# Patient Record
Sex: Female | Born: 1985 | Race: White | Hispanic: Yes | Marital: Married | Smoking: Never smoker
Health system: Southern US, Community
[De-identification: ages and names within clinical notes are randomized; demographics above are authoritative.]

## PROBLEM LIST (undated history)

## (undated) ENCOUNTER — Inpatient Hospital Stay (HOSPITAL_COMMUNITY): Payer: Self-pay

## (undated) DIAGNOSIS — N6019 Diffuse cystic mastopathy of unspecified breast: Secondary | ICD-10-CM

## (undated) DIAGNOSIS — Z9889 Other specified postprocedural states: Secondary | ICD-10-CM

## (undated) DIAGNOSIS — F32A Depression, unspecified: Secondary | ICD-10-CM

## (undated) DIAGNOSIS — R112 Nausea with vomiting, unspecified: Secondary | ICD-10-CM

## (undated) DIAGNOSIS — O009 Unspecified ectopic pregnancy without intrauterine pregnancy: Secondary | ICD-10-CM

## (undated) DIAGNOSIS — N390 Urinary tract infection, site not specified: Secondary | ICD-10-CM

## (undated) DIAGNOSIS — T8859XA Other complications of anesthesia, initial encounter: Secondary | ICD-10-CM

---

## 2014-01-21 DIAGNOSIS — O009 Unspecified ectopic pregnancy without intrauterine pregnancy: Secondary | ICD-10-CM

## 2014-01-21 HISTORY — DX: Unspecified ectopic pregnancy without intrauterine pregnancy: O00.90

## 2014-01-21 HISTORY — PX: LAPAROSCOPIC ABDOMINAL EXPLORATION: SHX6249

## 2014-03-24 ENCOUNTER — Ambulatory Visit (HOSPITAL_COMMUNITY)
Admission: EM | Admit: 2014-03-24 | Discharge: 2014-03-25 | Disposition: A | Discharge status: Home / Self Care | Payer: Self-pay | Attending: Family Medicine | Admitting: Family Medicine

## 2014-03-24 ENCOUNTER — Encounter (HOSPITAL_COMMUNITY): Payer: Self-pay | Admitting: Emergency Medicine

## 2014-03-24 DIAGNOSIS — R102 Pelvic and perineal pain: Secondary | ICD-10-CM

## 2014-03-24 DIAGNOSIS — N83519 Torsion of ovary and ovarian pedicle, unspecified side: Secondary | ICD-10-CM | POA: Insufficient documentation

## 2014-03-24 DIAGNOSIS — N8329 Other ovarian cysts: Secondary | ICD-10-CM | POA: Insufficient documentation

## 2014-03-24 DIAGNOSIS — Z87891 Personal history of nicotine dependence: Secondary | ICD-10-CM | POA: Insufficient documentation

## 2014-03-24 DIAGNOSIS — N8351 Torsion of ovary and ovarian pedicle: Secondary | ICD-10-CM | POA: Insufficient documentation

## 2014-03-24 HISTORY — DX: Nausea with vomiting, unspecified: R11.2

## 2014-03-24 HISTORY — DX: Other specified postprocedural states: Z98.890

## 2014-03-24 LAB — BASIC METABOLIC PANEL
Anion gap: 3 — ABNORMAL LOW (ref 5–15)
BUN: 11 mg/dL (ref 6–23)
CO2: 27 mmol/L (ref 19–32)
Calcium: 8.9 mg/dL (ref 8.4–10.5)
Chloride: 104 mmol/L (ref 96–112)
Creatinine, Ser: 0.7 mg/dL (ref 0.50–1.10)
GFR calc Af Amer: >90 mL/min (ref >90)
GFR calc non Af Amer: >90 mL/min (ref >90)
GLUCOSE: 95 mg/dL (ref 70–99)
POTASSIUM: 3.7 mmol/L (ref 3.5–5.1)
SODIUM: 134 mmol/L — AB (ref 135–145)

## 2014-03-24 LAB — CBC
HCT: 38.7 % (ref 36.0–46.0)
Hemoglobin: 13.2 g/dL (ref 12.0–15.0)
MCH: 29.8 pg (ref 26.0–34.0)
MCHC: 34.1 g/dL (ref 30.0–36.0)
MCV: 87.4 fL (ref 78.0–100.0)
Platelets: 264 10*3/uL (ref 150–400)
RBC: 4.43 MIL/uL (ref 3.87–5.11)
RDW: 12.8 % (ref 11.5–15.5)
WBC: 11.5 10*3/uL — AB (ref 4.0–10.5)

## 2014-03-24 LAB — LIPASE, BLOOD: Lipase: 41 U/L (ref 11–59)

## 2014-03-24 MED ORDER — ONDANSETRON HCL 4 MG/2ML IJ SOLN
4.0000 mg | Freq: Once | INTRAMUSCULAR | Status: AC
  Administered 2014-03-24: 4 mg via INTRAVENOUS
  Filled 2014-03-24: qty 2

## 2014-03-24 MED ORDER — FENTANYL CITRATE 0.05 MG/ML IJ SOLN
50.0000 ug | Freq: Once | INTRAMUSCULAR | Status: AC
  Administered 2014-03-24: 50 ug via INTRAVENOUS
  Filled 2014-03-24: qty 2

## 2014-03-24 MED ORDER — GI COCKTAIL ~~LOC~~
30.0000 mL | Freq: Once | ORAL | Status: AC
  Administered 2014-03-24: 30 mL via ORAL
  Filled 2014-03-24: qty 30

## 2014-03-24 NOTE — ED Provider Notes (Signed)
CSN: [id number]     Arrival date & time 03/24/14  2152 History   First MD Initiated Contact with Patient 03/24/14 2320     Chief Complaint  Patient presents with  . Abdominal Pain     (Consider location/radiation/quality/duration/timing/severity/associated sxs/prior Treatment) Patient is a 29 y.o. female presenting with abdominal pain. The history is provided by the patient.  Abdominal Pain Associated symptoms: nausea   Associated symptoms: no chest pain, no diarrhea, no shortness of breath and no vomiting    patient with acute onset of abdominal pain. It is in the left side of her abdomen. It is severe. Is worse with sitting down. Some nausea without vomiting. No diarrhea. Began acutely after eating some spicy food. States that she had the same thing a few months ago in [place] when diagnosed with colitis. Was given anti-inflammatories and some other medications. No fevers. Denies dysuria. Denies vaginal discharge.  History reviewed. No pertinent past medical history. History reviewed. No pertinent past surgical history. No family history on file. History  Substance Use Topics  . Smoking status: Not on file  . Smokeless tobacco: Not on file  . Alcohol Use: No   OB History    No data available     Review of Systems  Constitutional: Negative for activity change and appetite change.  Eyes: Negative for pain.  Respiratory: Negative for chest tightness and shortness of breath.   Cardiovascular: Negative for chest pain and leg swelling.  Gastrointestinal: Positive for nausea and abdominal pain. Negative for vomiting and diarrhea.  Genitourinary: Negative for flank pain.  Musculoskeletal: Negative for back pain and neck stiffness.  Skin: Negative for rash.  Neurological: Negative for weakness, numbness and headaches.  Psychiatric/Behavioral: Negative for behavioral problems.      Allergies  Review of patient's allergies indicates no known allergies.  Home Medications   Prior  to Admission medications   Not on File   BP 135/88 mmHg  Pulse 65  Temp(Src) 97.9 F (36.6 C)  Resp 20  Wt 129 lb (58.514 kg)  SpO2 100% Physical Exam  Constitutional: She is oriented to person, place, and time. She appears well-developed and well-nourished.  Patient standing up and over the side of the bed.  HENT:  Head: Normocephalic and atraumatic.  Eyes: Pupils are equal, round, and reactive to light.  Neck: Normal range of motion.  Cardiovascular: Normal rate, regular rhythm and normal heart sounds.   No murmur heard. Pulmonary/Chest: Effort normal and breath sounds normal. No respiratory distress. She has no wheezes. She has no rales.  Abdominal: Soft. Bowel sounds are normal. She exhibits no distension. There is tenderness. There is no rebound and no guarding.  Tenderness to left upper to left lower abdomen. No rebound or guarding.  Musculoskeletal: Normal range of motion.  Neurological: She is alert and oriented to person, place, and time. No cranial nerve deficit.  Skin: Skin is warm and dry.  Psychiatric: Her speech is normal.  Nursing note and vitals reviewed.   ED Course  Procedures (including critical care time) Labs Review Labs Reviewed  CBC - Abnormal; Notable for the following:    WBC 11.5 (*)    All other components within normal limits  BASIC METABOLIC PANEL - Abnormal; Notable for the following:    Sodium 134 (*)    Anion gap 3 (*)    All other components within normal limits  LIPASE, BLOOD  URINALYSIS, ROUTINE W REFLEX MICROSCOPIC  HEPATIC FUNCTION PANEL  POC URINE PREG, ED  Imaging Review No results found.   EKG Interpretation None      MDM   Final diagnoses:  None    Patient with abdominal pain. Acute onset but had reported colitis in the past with similar symptoms. Denies urinary symptoms. Denies pregnancy. Urine is pending. Will get CT scan due to tenderness.    Juliet RudeNathan R. Rubin PayorPickering, MD 03/24/14 2352

## 2014-03-24 NOTE — ED Notes (Signed)
Pt called out asking for pain medication.

## 2014-03-24 NOTE — ED Notes (Signed)
Pt presents with left sided abdominal pain that radiates into lower abdomen after eating hot sauce tonight, admits to vomiting X 2.  Denies vaginal or urinary symptoms.

## 2014-03-25 ENCOUNTER — Emergency Department (HOSPITAL_COMMUNITY): Payer: Self-pay

## 2014-03-25 ENCOUNTER — Inpatient Hospital Stay (HOSPITAL_COMMUNITY): Payer: Self-pay | Admitting: Anesthesiology

## 2014-03-25 ENCOUNTER — Encounter (HOSPITAL_COMMUNITY): Payer: Self-pay | Admitting: Radiology

## 2014-03-25 ENCOUNTER — Encounter (HOSPITAL_COMMUNITY)
Admission: EM | Disposition: A | Discharge status: Home / Self Care | Payer: Self-pay | Source: Home / Self Care | Attending: Emergency Medicine

## 2014-03-25 DIAGNOSIS — N8352 Torsion of fallopian tube: Secondary | ICD-10-CM

## 2014-03-25 DIAGNOSIS — N83519 Torsion of ovary and ovarian pedicle, unspecified side: Secondary | ICD-10-CM | POA: Insufficient documentation

## 2014-03-25 HISTORY — PX: LAPAROSCOPY: SHX197

## 2014-03-25 LAB — HEPATIC FUNCTION PANEL
ALBUMIN: 4.1 g/dL (ref 3.5–5.2)
ALT: 10 U/L (ref 0–35)
AST: 21 U/L (ref 0–37)
Alkaline Phosphatase: 45 U/L (ref 39–117)
Bilirubin, Direct: <0.1 mg/dL (ref 0.0–0.5)
Total Bilirubin: 0.3 mg/dL (ref 0.3–1.2)
Total Protein: 7.1 g/dL (ref 6.0–8.3)

## 2014-03-25 LAB — URINALYSIS, ROUTINE W REFLEX MICROSCOPIC
Bilirubin Urine: NEGATIVE
Glucose, UA: NEGATIVE mg/dL
Hgb urine dipstick: NEGATIVE
Leukocytes, UA: NEGATIVE
NITRITE: NEGATIVE
Protein, ur: 30 mg/dL — AB
Specific Gravity, Urine: 1.033 — ABNORMAL HIGH (ref 1.005–1.030)
UROBILINOGEN UA: 0.2 mg/dL (ref 0.0–1.0)
pH: 8 (ref 5.0–8.0)

## 2014-03-25 LAB — URINE MICROSCOPIC-ADD ON

## 2014-03-25 LAB — ABO/RH: ABO/RH(D): AB POS

## 2014-03-25 LAB — TYPE AND SCREEN
ABO/RH(D): AB POS
ANTIBODY SCREEN: NEGATIVE

## 2014-03-25 LAB — POC URINE PREG, ED: Preg Test, Ur: NEGATIVE

## 2014-03-25 SURGERY — LAPAROSCOPY OPERATIVE
Anesthesia: General | Site: Abdomen | Laterality: Right

## 2014-03-25 MED ORDER — GLYCOPYRROLATE 0.2 MG/ML IJ SOLN
INTRAMUSCULAR | Status: AC
  Filled 2014-03-25: qty 3

## 2014-03-25 MED ORDER — PHENYLEPHRINE 40 MCG/ML (10ML) SYRINGE FOR IV PUSH (FOR BLOOD PRESSURE SUPPORT)
PREFILLED_SYRINGE | INTRAVENOUS | Status: AC
  Filled 2014-03-25: qty 10

## 2014-03-25 MED ORDER — MORPHINE SULFATE 4 MG/ML IJ SOLN
4.0000 mg | Freq: Once | INTRAMUSCULAR | Status: AC
  Administered 2014-03-25: 4 mg via INTRAVENOUS
  Filled 2014-03-25: qty 1

## 2014-03-25 MED ORDER — MORPHINE SULFATE 4 MG/ML IJ SOLN
4.0000 mg | Freq: Once | INTRAMUSCULAR | Status: AC
Start: 2014-03-25 — End: 2014-03-25
  Administered 2014-03-25: 4 mg via INTRAVENOUS
  Filled 2014-03-25: qty 1

## 2014-03-25 MED ORDER — DEXAMETHASONE SODIUM PHOSPHATE 10 MG/ML IJ SOLN
INTRAMUSCULAR | Status: DC | PRN
  Administered 2014-03-25: 4 mg via INTRAVENOUS

## 2014-03-25 MED ORDER — FENTANYL CITRATE 0.05 MG/ML IJ SOLN
INTRAMUSCULAR | Status: AC
  Filled 2014-03-25: qty 5

## 2014-03-25 MED ORDER — LACTATED RINGERS IV SOLN
INTRAVENOUS | Status: DC | PRN
  Administered 2014-03-25 (×2): via INTRAVENOUS

## 2014-03-25 MED ORDER — ONDANSETRON HCL 4 MG/2ML IJ SOLN
INTRAMUSCULAR | Status: AC
  Filled 2014-03-25: qty 2

## 2014-03-25 MED ORDER — NEOSTIGMINE METHYLSULFATE 10 MG/10ML IV SOLN
INTRAVENOUS | Status: AC
  Filled 2014-03-25: qty 1

## 2014-03-25 MED ORDER — LIDOCAINE HCL (CARDIAC) 20 MG/ML IV SOLN
INTRAVENOUS | Status: AC
Start: 2014-03-25 — End: 2014-03-25
  Filled 2014-03-25: qty 5

## 2014-03-25 MED ORDER — MIDAZOLAM HCL 2 MG/2ML IJ SOLN
INTRAMUSCULAR | Status: AC
Start: 2014-03-25 — End: 2014-03-25
  Filled 2014-03-25: qty 2

## 2014-03-25 MED ORDER — HYDROMORPHONE HCL 1 MG/ML IJ SOLN
1.0000 mg | Freq: Once | INTRAMUSCULAR | Status: AC
  Administered 2014-03-25: 1 mg via INTRAVENOUS
  Filled 2014-03-25: qty 1

## 2014-03-25 MED ORDER — PROPOFOL 10 MG/ML IV BOLUS
INTRAVENOUS | Status: AC
  Filled 2014-03-25: qty 20

## 2014-03-25 MED ORDER — PHENYLEPHRINE HCL 10 MG/ML IJ SOLN
INTRAMUSCULAR | Status: DC | PRN
  Administered 2014-03-25 (×2): 80 ug via INTRAVENOUS

## 2014-03-25 MED ORDER — IOHEXOL 300 MG/ML  SOLN
100.0000 mL | Freq: Once | INTRAMUSCULAR | Status: AC | PRN
  Administered 2014-03-25: 100 mL via INTRAVENOUS

## 2014-03-25 MED ORDER — DEXAMETHASONE SODIUM PHOSPHATE 4 MG/ML IJ SOLN
INTRAMUSCULAR | Status: AC
  Filled 2014-03-25: qty 1

## 2014-03-25 MED ORDER — IOHEXOL 300 MG/ML  SOLN
25.0000 mL | Freq: Once | INTRAMUSCULAR | Status: AC | PRN
  Administered 2014-03-25: 25 mL via ORAL

## 2014-03-25 MED ORDER — GLYCOPYRROLATE 0.2 MG/ML IJ SOLN
INTRAMUSCULAR | Status: DC | PRN
  Administered 2014-03-25: 0.6 mg via INTRAVENOUS

## 2014-03-25 MED ORDER — NEOSTIGMINE METHYLSULFATE 10 MG/10ML IV SOLN
INTRAVENOUS | Status: DC | PRN
  Administered 2014-03-25: 4 mg via INTRAVENOUS

## 2014-03-25 MED ORDER — PROPOFOL 10 MG/ML IV BOLUS
INTRAVENOUS | Status: DC | PRN
  Administered 2014-03-25: 160 mg via INTRAVENOUS

## 2014-03-25 MED ORDER — ROCURONIUM BROMIDE 100 MG/10ML IV SOLN
INTRAVENOUS | Status: AC
  Filled 2014-03-25: qty 1

## 2014-03-25 MED ORDER — OXYCODONE-ACETAMINOPHEN 5-325 MG PO TABS: 1.0000 | ORAL_TABLET | Freq: Four times a day (QID) | ORAL | Status: DC | PRN

## 2014-03-25 MED ORDER — FENTANYL CITRATE 0.05 MG/ML IJ SOLN: 25.0000 ug | INTRAMUSCULAR | Status: DC | PRN

## 2014-03-25 MED ORDER — ROCURONIUM BROMIDE 100 MG/10ML IV SOLN
INTRAVENOUS | Status: DC | PRN
  Administered 2014-03-25: 20 mg via INTRAVENOUS

## 2014-03-25 MED ORDER — FENTANYL CITRATE 0.05 MG/ML IJ SOLN
INTRAMUSCULAR | Status: DC | PRN
  Administered 2014-03-25: 25 ug via INTRAVENOUS
  Administered 2014-03-25 (×2): 50 ug via INTRAVENOUS

## 2014-03-25 MED ORDER — BUPIVACAINE HCL (PF) 0.25 % IJ SOLN
INTRAMUSCULAR | Status: DC | PRN
  Administered 2014-03-25: 6 mL

## 2014-03-25 MED ORDER — KETOROLAC TROMETHAMINE 30 MG/ML IJ SOLN
INTRAMUSCULAR | Status: DC | PRN
  Administered 2014-03-25: 30 mg via INTRAVENOUS

## 2014-03-25 MED ORDER — KETOROLAC TROMETHAMINE 30 MG/ML IJ SOLN
INTRAMUSCULAR | Status: AC
  Filled 2014-03-25: qty 1

## 2014-03-25 MED ORDER — MIDAZOLAM HCL 2 MG/2ML IJ SOLN
INTRAMUSCULAR | Status: DC | PRN
  Administered 2014-03-25: 1 mg via INTRAVENOUS

## 2014-03-25 MED ORDER — LIDOCAINE HCL (CARDIAC) 20 MG/ML IV SOLN
INTRAVENOUS | Status: DC | PRN
  Administered 2014-03-25: 80 mg via INTRAVENOUS

## 2014-03-25 MED ORDER — LACTATED RINGERS IR SOLN
Status: DC | PRN
  Administered 2014-03-25: 3000 mL

## 2014-03-25 MED ORDER — ONDANSETRON HCL 4 MG/2ML IJ SOLN
INTRAMUSCULAR | Status: DC | PRN
  Administered 2014-03-25: 4 mg via INTRAVENOUS

## 2014-03-25 SURGICAL SUPPLY — 30 items
CABLE HIGH FREQUENCY MONO STRZ (ELECTRODE) IMPLANT
CATH ROBINSON RED A/P 16FR (CATHETERS) IMPLANT
CHLORAPREP W/TINT 26ML (MISCELLANEOUS) ×3 IMPLANT
CLOTH BEACON ORANGE TIMEOUT ST (SAFETY) ×3 IMPLANT
DRSG COVADERM PLUS 2X2 (GAUZE/BANDAGES/DRESSINGS) ×3 IMPLANT
DRSG OPSITE POSTOP 3X4 (GAUZE/BANDAGES/DRESSINGS) ×3 IMPLANT
FORCEPS CUTTING 33CM 5MM (CUTTING FORCEPS) IMPLANT
FORCEPS CUTTING 45CM 5MM (CUTTING FORCEPS) IMPLANT
GLOVE BIOGEL PI IND STRL 7.0 (GLOVE) ×1 IMPLANT
GLOVE BIOGEL PI INDICATOR 7.0 (GLOVE) ×2
GLOVE ECLIPSE 7.0 STRL STRAW (GLOVE) ×6 IMPLANT
GOWN STRL REUS W/TWL LRG LVL3 (GOWN DISPOSABLE) ×9 IMPLANT
LIQUID BAND (GAUZE/BANDAGES/DRESSINGS) ×3 IMPLANT
NS IRRIG 1000ML POUR BTL (IV SOLUTION) ×3 IMPLANT
PACK LAPAROSCOPY BASIN (CUSTOM PROCEDURE TRAY) ×3 IMPLANT
PAD POSITIONER PINK NONSTERILE (MISCELLANEOUS) ×3 IMPLANT
POUCH SPECIMEN RETRIEVAL 10MM (ENDOMECHANICALS) IMPLANT
PROTECTOR NERVE ULNAR (MISCELLANEOUS) ×3 IMPLANT
SET IRRIG TUBING LAPAROSCOPIC (IRRIGATION / IRRIGATOR) ×3 IMPLANT
SHEARS HARMONIC ACE PLUS 36CM (ENDOMECHANICALS) ×3 IMPLANT
SUT VIC AB 3-0 X1 27 (SUTURE) ×3 IMPLANT
SUT VICRYL 0 UR6 27IN ABS (SUTURE) ×6 IMPLANT
SUT VICRYL 4-0 PS2 18IN ABS (SUTURE) ×3 IMPLANT
TOWEL OR 17X24 6PK STRL BLUE (TOWEL DISPOSABLE) ×6 IMPLANT
TRAY FOLEY CATH 14FR (SET/KITS/TRAYS/PACK) ×3 IMPLANT
TROCAR BALLN 12MMX100 BLUNT (TROCAR) IMPLANT
TROCAR OPTI TIP 5M 100M (ENDOMECHANICALS) ×6 IMPLANT
TROCAR XCEL DIL TIP R 11M (ENDOMECHANICALS) IMPLANT
WARMER LAPAROSCOPE (MISCELLANEOUS) ×3 IMPLANT
WATER STERILE IRR 1000ML POUR (IV SOLUTION) ×3 IMPLANT

## 2014-03-25 NOTE — Anesthesia Postprocedure Evaluation (Signed)
  Anesthesia Post-op Note  Patient: Ian BushmanConsuelo Nunez  Procedure(s) Performed: Procedure(s): LAPAROSCOPY OPERATIVE with removal of right ovarian cyst wall (Right)  Patient Location: PACU  Anesthesia Type:General  Level of Consciousness: awake, alert  and oriented  Airway and Oxygen Therapy: Patient Spontanous Breathing  Post-op Pain: mild  Post-op Assessment: Post-op Vital signs reviewed, Patient's Cardiovascular Status Stable, Respiratory Function Stable, Patent Airway, No signs of Nausea or vomiting and Pain level controlled  Post-op Vital Signs: Reviewed and stable  Last Vitals:  Filed Vitals:   03/25/14 0930  BP: 124/58  Pulse: 61  Temp: 36.9 C  Resp: 23    Complications: No apparent anesthesia complications

## 2014-03-25 NOTE — Anesthesia Procedure Notes (Signed)
Procedure Name: Intubation Date/Time: 03/25/2014 7:14 AM Performed by: Suella GroveMOORE, Annetta Deiss C Pre-anesthesia Checklist: Patient identified, Emergency Drugs available, Suction available, Patient being monitored and Timeout performed Patient Re-evaluated:Patient Re-evaluated prior to inductionOxygen Delivery Method: Simple face mask and Circle system utilized Preoxygenation: Pre-oxygenation with 100% oxygen Intubation Type: IV induction Ventilation: Mask ventilation without difficulty Laryngoscope Size: Mac and 3 Grade View: Grade II Tube type: Oral Tube size: 7.0 mm Number of attempts: 1 Airway Equipment and Method: Stylet Placement Confirmation: ETT inserted through vocal cords under direct vision,  positive ETCO2 and breath sounds checked- equal and bilateral Secured at: 20 cm Tube secured with: Tape Dental Injury: Teeth and Oropharynx as per pre-operative assessment

## 2014-03-25 NOTE — Transfer of Care (Signed)
Immediate Anesthesia Transfer of Care Note  Patient: Meredith BushmanConsuelo Nunez  Procedure(s) Performed: Procedure(s): LAPAROSCOPY OPERATIVE with removal of right ovarian cyst wall (Right)  Patient Location: PACU  Anesthesia Type:General  Level of Consciousness: awake, sedated and patient cooperative  Airway & Oxygen Therapy: Patient Spontanous Breathing and Patient connected to nasal cannula oxygen  Post-op Assessment: Report given to RN and Post -op Vital signs reviewed and stable  Post vital signs: Reviewed and stable  Last Vitals:  Filed Vitals:   03/25/14 0620  BP: 130/72  Pulse: 90  Temp: 36.9 C  Resp: 20    Complications: No apparent anesthesia complications

## 2014-03-25 NOTE — Anesthesia Preprocedure Evaluation (Signed)
Anesthesia Evaluation  Patient identified by MRN, date of birth, ID band Patient awake    Reviewed: Allergy & Precautions, H&P , Patient's Chart, lab work & pertinent test results, reviewed documented beta blocker date and time   Airway Mallampati: II  TM Distance: >3 FB Neck ROM: full    Dental no notable dental hx.    Pulmonary former smoker,  breath sounds clear to auscultation  Pulmonary exam normal       Cardiovascular Rhythm:regular Rate:Normal     Neuro/Psych    GI/Hepatic   Endo/Other    Renal/GU      Musculoskeletal   Abdominal   Peds  Hematology   Anesthesia Other Findings O/w healthy, non-smoker, NPO since 03-24-14  Reproductive/Obstetrics                             Anesthesia Physical Anesthesia Plan  ASA: II and emergent  Anesthesia Plan: General   Post-op Pain Management:    Induction: Intravenous  Airway Management Planned: Oral ETT  Additional Equipment:   Intra-op Plan:   Post-operative Plan: Extubation in OR  Informed Consent: I have reviewed the patients History and Physical, chart, labs and discussed the procedure including the risks, benefits and alternatives for the proposed anesthesia with the patient or authorized representative who has indicated his/her understanding and acceptance.   Dental Advisory Given and Dental advisory given  Plan Discussed with: CRNA and Surgeon  Anesthesia Plan Comments: (  Discussed general anesthesia, including possible nausea, instrumentation of airway, sore throat,pulmonary aspiration, etc. I asked if the were any outstanding questions, or  concerns before we proceeded. )        Anesthesia Quick Evaluation

## 2014-03-25 NOTE — ED Notes (Signed)
MD at bedside. 

## 2014-03-25 NOTE — Discharge Instructions (Signed)
Ciruga laparoscpica para la torsin de ovario  (Laparoscopic Ovarian Torsion Surgery) Los ovarios son los rganos reproductores femeninos que producen vulos. Se llama torsin de ovario cuando un ovario se retuerce y corta su propio flujo de Sappingtonsangre. Si el ovario se retuerce, no puede recibir Tajikistansangre y se inflama. Esta hinchazn puede causar dolor plvico muy intenso que puede aparecer y Geneticist, moleculardesaparecer. Es necesaria una ciruga laparoscpica de la torsin ovrica para Adult nurseresolver la torsin y Ambulance personrestablecer el flujo sanguneo del ovario.  INFORME A SU MDICO ACERCA DE:   Alergias a alimentos o medicamentos.  Medicamentos que [place]utiliza, incluyendo vitaminas, hierbas, gotas oftlmicas, medicamentos de venta libre y cremas.  Uso de corticoides (por va oral o cremas).  Problemas anteriores debido a anestsicos o a medicamentos que Morgan Stanleydisminuyen la sensibilidad.  Antecedentes de hemorragias o cogulos sanguneos.  Cirugas anteriores.  Otros problemas de salud, incluyendo hipertensin, diabetes y problemas renales.  Posible embarazo. RIESGOS Y COMPLICACIONES   Reaccin alrgica a los medicamentos.  Dificultad para respirar.  Sangrado.  Infeccin.  Daos en otras estructuras del ovario. ANTES DEL PROCEDIMIENTO   Trate de consultar a su mdico si debe cambiar o suspender los medicamentos que toma habitualmente.  Si es posible, no coma ni beba nada durante al menos 8 horas antes del procedimiento.  Si fuma, deje de hacerlo. Si deja de fumar mejorar su salud despus de la Azerbaijanciruga.  Consiga que alguien lo lleve a su casa despus de la Azerbaijanciruga y lo ayude en casa mientras se recupera. PROCEDIMIENTO   Le colocarn una va intravenosa (IV) en una vena para administrarle lquidos y medicamentos.  Recibir medicamentos para relajarse y otros que lo harn dormir (anestesia general).  Le colocarn un tubo flexible (catter) en la vejiga para drenar la orina.  Tambin insertarn un tubo a travs de  la nariz o la boca hacia el estmago (sonda nasogstrica). La sonda nasogstrica drena los jugos digestivos e impide que sienta ganas de vomitar (nuseas) o tenga vmitos.  Se realizarn en el abdomen tres o cuatro incisiones pequeas (laparoscopa) o una incisin abierta. El cirujano decidir el enfoque adecuado.  Se corrige la torsin del ovario con pequeos instrumentos insertados a travs de las incisiones laparoscpicas.  Luego se observa el ovario, para verificar si vuelve el flujo sanguneo. Si no se puede restablecer el flujo de L-3 Communicationssangre en el ovario, puede tener que ser extirpado Lyondell Chemicalquirrgicamente. DESPUS DEL PROCEDIMIENTO   Planifique para permanecer en el hospital durante 1 da o menos.  Es posible que sienta clicos abdominales y Engineer, miningdolor de Advertising copywritergarganta. El dolor se puede controlar con medicamentos.  Posiblemente deba seguir una dieta lquida por un tiempo. Lo ms probable es volver a la dieta habitual y Animal nutritionisttolerarla bien al da siguiente de la Azerbaijanciruga.  Tendr que orinar a travs de un catter. El catter se retira despus de la Azerbaijanciruga.  Le harn controles regulares de la temperatura, frecuencia cardaca y respiratoria, presin arterial y nivel de oxgeno.  Seguir General Millsusando medias de compresin en las piernas hasta que pueda moverse.  Usar un dispositivo o har ejercicios de respiracin para mantener los pulmones limpios.  Lo alentarn a caminar lo ms pronto posible.  Espere una recuperacin completa de 4 a 6 semanas despus de la Azerbaijanciruga. Document Released: 10/02/2011 [facility] Patient Information 2015 [place], [place]. This information is not intended to replace advice given to you by your health care provider. Make sure you discuss any questions you have with your health care provider.

## 2014-03-25 NOTE — H&P (Signed)
Chief Complaint  Patient presents with  . Abdominal Pain   HPI   Meredith BushmanConsuelo Gonzales is a 29 y.o. [id number] who [facility]. She states that she started having pain last nigh at 1930 after eating a spicy meal. [facility]. Bedside US showed ovarian cyst, and formal US showed a torsed left ovary. She last ate at 1700 on 03/24/14.  Past Medical History  Diagnosis Date  . PONV (postoperative nausea and vomiting)     Past Surgical History  Procedure Laterality Date  . Cesarean sectionx 2      History reviewed. No pertinent family history.  History  Substance Use Topics  . Smoking status: Former Games developermoker  . Smokeless tobacco: Not on file  . Alcohol Use: No    Allergies: No Known Allergies  No prescriptions prior to admission    ROS Physical Exam   Blood pressure 130/72, pulse 90, temperature 98.4 F (36.9 C), temperature source Oral, resp. rate 20, weight 58.514 kg (129 lb), SpO2 98 %.  Physical Exam  Nursing note and vitals reviewed. Constitutional: She is oriented to person, place, and time. She appears well-developed and well-nourished. No distress.  Cardiovascular: Normal rate.  Respiratory: Effort normal.  GI: Soft. There is tenderness. There is no rebound and no guarding.  Neurological: She is alert and oriented to person, place, and time.  Skin: Skin is warm and dry.  Psychiatric: She has a normal mood and affect.    MAU Course  Procedures  Results for orders placed or performed during the hospital encounter of 03/24/14 (from the past 24 hour(s))  CBC Status: Abnormal   Collection Time: 03/24/14 9:58 PM  Result Value Ref Range   WBC 11.5 (H) 4.0 - 10.5 K/uL   RBC 4.43 3.87 - 5.11 MIL/uL   Hemoglobin 13.2 12.0 - 15.0 g/dL   HCT 14.738.7 82.936.0 - 56.246.0 %   MCV 87.4 78.0 - 100.0 fL   MCH 29.8 26.0 - 34.0 pg    MCHC 34.1 30.0 - 36.0 g/dL   RDW 13.012.8 86.511.5 - 78.415.5 %   Platelets 264 150 - 400 K/uL  Basic metabolic panel Status: Abnormal   Collection Time: 03/24/14 9:58 PM  Result Value Ref Range   Sodium 134 (L) 135 - 145 mmol/L   Potassium 3.7 3.5 - 5.1 mmol/L   Chloride 104 96 - 112 mmol/L   CO2 27 19 - 32 mmol/L   Glucose, Bld 95 70 - 99 mg/dL   BUN 11 6 - 23 mg/dL   Creatinine, Ser 6.960.70 0.50 - 1.10 mg/dL   Calcium 8.9 8.4 - 29.510.5 mg/dL   GFR calc non Af Amer >90 >90 mL/min   GFR calc Af Amer >90 >90 mL/min   Anion gap 3 (L) 5 - 15  Lipase, blood Status: None   Collection Time: 03/24/14 9:58 PM  Result Value Ref Range   Lipase 41 11 - 59 U/L  Hepatic function panel Status: None   Collection Time: 03/24/14 9:58 PM  Result Value Ref Range   Total Protein 7.1 6.0 - 8.3 g/dL   Albumin 4.1 3.5 - 5.2 g/dL   AST 21 0 - 37 U/L   ALT 10 0 - 35 U/L   Alkaline Phosphatase 45 39 - 117 U/L   Total Bilirubin 0.3 0.3 - 1.2 mg/dL   Bilirubin, Direct <2.8<0.1 0.0 - 0.5 mg/dL   Indirect Bilirubin NOT CALCULATED 0.3 -  0.9 mg/dL  Urinalysis, Routine w reflex microscopic Status: Abnormal   Collection Time: 03/25/14 12:37 AM  Result Value Ref Range   Color, Urine YELLOW YELLOW   APPearance CLOUDY (A) CLEAR   Specific Gravity, Urine 1.033 (H) 1.005 - 1.030   pH 8.0 5.0 - 8.0   Glucose, UA NEGATIVE NEGATIVE mg/dL   Hgb urine dipstick NEGATIVE NEGATIVE   Bilirubin Urine NEGATIVE NEGATIVE   Ketones, ur >80 (A) NEGATIVE mg/dL   Protein, ur 30 (A) NEGATIVE mg/dL   Urobilinogen, UA 0.2 0.0 - 1.0 mg/dL   Nitrite NEGATIVE NEGATIVE   Leukocytes, UA NEGATIVE NEGATIVE  Urine microscopic-add on Status: Abnormal   Collection Time: 03/25/14 12:37 AM  Result Value Ref Range   Squamous Epithelial / LPF RARE RARE   WBC,  UA 0-2 <3 WBC/hpf   Bacteria, UA MANY (A) RARE   Urine-Other AMORPHOUS URATES/PHOSPHATES   POC Urine Pregnancy, ED (do NOT order at [facility]) Status: None   Collection Time: 03/25/14 12:42 AM  Result Value Ref Range   Preg Test, Ur NEGATIVE NEGATIVE   US Transvaginal Non-ob  03/25/2014 CLINICAL DATA: Pelvic pain. EXAM: TRANSABDOMINAL AND TRANSVAGINAL ULTRASOUND OF PELVIS DOPPLER ULTRASOUND OF OVARIES TECHNIQUE: Both transabdominal and transvaginal ultrasound examinations of the pelvis were performed. Transabdominal technique was performed for global imaging of the pelvis including uterus, ovaries, adnexal regions, and pelvic cul-de-sac. It was necessary to proceed with endovaginal exam following the transabdominal exam to visualize the ovaries. Color and duplex Doppler ultrasound was utilized to evaluate blood flow to the ovaries. COMPARISON: Abdominal CT from the same day FINDINGS: Uterus Measurements: 10 x 5 x 3 cm. No fibroids or other mass visualized. Endometrium There is an IUD which is in good position. No endometrial thickening. Right ovary Measurements: 3.4 x 1.7 x 2.1 cm. Normal appearance/no adnexal mass. Color Doppler flow was present. Left ovary 8 x 6 x 8 cm, enlarged secondary to a 5.7 cm avascular mass which has mid level echoes and web like septation suggestive of fibrin strands. There is no color Doppler flow within the thickened surrounding ovarian parenchyma. Critical Value/emergent results were called by telephone at the time of interpretation on 03/25/2014 at 5:43 am to Dr. Deanna Artis , who verbally acknowledged these results. IMPRESSION: 1. Left ovarian torsion. 2. 5.8 cm probable hemorrhagic cyst in the left ovary. Followup imaging is recommended in 12 weeks. Electronically Signed By: Marnee Spring M.D. On: 03/25/2014 05:43    Ct Abdomen Pelvis W Contrast  03/25/2014 CLINICAL DATA: Abdominal pain. Recent diagnosis of colitis.  EXAM: CT ABDOMEN AND PELVIS WITH CONTRAST TECHNIQUE: Multidetector CT imaging of the abdomen and pelvis was performed using the standard protocol following bolus administration of intravenous contrast. CONTRAST: 25mL OMNIPAQUE IOHEXOL 300 MG/ML SOLN, OMNIPAQUE IOHEXOL 300 MG/ML SOLN COMPARISON: None. FINDINGS: BODY WALL: Unremarkable. LOWER CHEST: Unremarkable. ABDOMEN/PELVIS: Liver: No focal abnormality. Biliary: No evidence of biliary obstruction or stone. Pancreas: Unremarkable. Spleen: Unremarkable. Adrenals: Unremarkable. Kidneys and ureters: No hydronephrosis or stone. Bladder: Unremarkable. Reproductive: There is a 5 cm cyst within the left ovary with the neighboring the ovary appearing low-density and expanded. IUD which is normally positioned. Bowel: No obstruction. Normal appendix. Retroperitoneum: No mass or adenopathy. Peritoneum: No ascites or pneumoperitoneum. Vascular: No acute abnormality. 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 IMPRESSION: 5 cm left ovarian cyst. Given left sided pain and edematous appearance of the surrounding ovary, recommend Doppler interrogation of ovarian blood flow. Electronically Signed By: Marnee Spring M.D. On: 03/25/2014  01:51    Assessment and Plan   1. Ovarian torsion   2. Pelvic pain in female     To the OR for laparoscopic detorsion and possible ovarian cystectomy.  Discussed possible oophorectomy. Risks include but are not limited to bleeding, infection, injury to surrounding structures, including bowel, bladder and ureters, blood clots, and death.  Likelihood of success is high.

## 2014-03-25 NOTE — ED Notes (Signed)
Pt still in ultrasound.

## 2014-03-25 NOTE — MAU Note (Signed)
[facility]     WITH INTERPRETER- MADAY-  SAYS SHE ATE  AT 5PM- AND  PAIN IN LEFT LOWER ABD  STARTED  AT 730PM-  SHE WENT  TO [facility]  AT 9PM .

## 2014-03-25 NOTE — ED Provider Notes (Signed)
MSE: Sent from CT to US based on results Called by Dr. Grace IsaacWatts for US results Case d/w Dr. Shawnie PonsPratt via phone who will accept patient to the MAU, d/w Herbert SetaHeather NP Interpretor phone used for interview and update of patient on CT and US results.  Pain started at 7 pm last meal was 530 ish  In pain, is pacing floor RRR CTA NABS FROM x 4  Pain medication ordered, emtala completed carelink here for transfer  Jaydynn Wolford Smitty CordsK Somer Trotter-Rasch, MD 03/25/14 [phone]

## 2014-03-25 NOTE — MAU Note (Signed)
TO OR VIA STRETCHER.

## 2014-03-25 NOTE — Op Note (Signed)
PROCEDURE DATE: 03/25/2014  PREOPERATIVE DIAGNOSES: Left ovarian torsion  POSTOPERATIVE DIAGNOSES: The same   PROCEDURE: Laparoscopic ovarian cystectomy  SURGEON: Dr. Reva Boresanya S Icyss Skog   ASSISTANT: None  ANESTHESIOLOGIST: Tyrone AppleMichael A. Malen GauzeFoster, MD MD - GETT  INDICATIONS: 29 y.o. [phone] with history of sudden onset abdominal pain last pm, found to have ovarian torsion  FINDINGS: Left ovarian torsion, blood in the abdomen, ovarian cyst, left, normal right tube and ovary   ESTIMATED BLOOD LOSS: 150 ml   SPECIMENS: Left ovarian cyst wall  COMPLICATIONS: None immediately known   PROCEDURE IN DETAIL: The patient had sequential compression devices applied to her lower extremities while in the preoperative area. She was then taken to the operating room where general anesthesia was administered and was found to be adequate. She was placed in the dorsal lithotomy position, and was prepped and draped in a sterile manner. A Foley catheter was inserted into her bladder and attached to constant drainage and an acorn tenaculum was placed in the cervix. After an adequate timeout was performed, attention was then turned to the patient's abdomen where a 11-mm skin incision was made in the umbilicus.  This was carried down to the underlying fascia and peritoneum.  The fascia was tagged with 0 Vicryl suture on a UR-6. Intraperitoneal placement was confirmed and insufflation done. A survey of the patient's pelvis and abdomen revealed the findings above. Two 5-mm left lower quadrant ports were then placed under direct visualization. On the left side, the ovary was noted to be torsed.  It was untwisted. There was a small omental adhesion to the anterior abdominal wall and this was taken down with the Harmonic scalpel.  Harmonic scalpel used to open the left ovarian parenchyma and blood and clot removed.  Cyst wall identified and removed from the ovary. Hemostasis at base achieved with Harmonic scalpel.  Specimen removed  though 5 mm ports in pieces. The operative site was surveyed, and found to be hemostatic. No intraoperative injury to other surrounding organs was noted. The abdomen was desufflated and all instruments were then removed from the patient's abdomen. No fascial closure was needed. All skin incisions were closed with 3-0 Vicryl subcuticular stitches/Dermabond.   Reva Boresanya S Gregorio Worley MD 03/25/2014 8:11 AM

## 2014-03-25 NOTE — MAU Provider Note (Signed)
History     CSN: [id number]  Arrival date and time: 03/24/14 2152   First Provider Initiated Contact with Patient 03/25/14 [phone]      Chief Complaint  Patient presents with  . Abdominal Pain   HPI   Meredith Gonzales is a 29 y.o. [id number] who presents today as a transfer from [facility] with an ovarian torsion. She states that she started having pain last nigh at 1930 after eating a spicy meal. She was seen at [facility] for presumed gastritis.  Bedside US showed ovarian cyst, and formal US showed a torsed left ovary. She last ate at 1700 on 03/24/14.  Past Medical History  Diagnosis Date  . PONV (postoperative nausea and vomiting)     Past Surgical History  Procedure Laterality Date  . Cesarean section      History reviewed. No pertinent family history.  History  Substance Use Topics  . Smoking status: Former Games developer  . Smokeless tobacco: Not on file  . Alcohol Use: No    Allergies: No Known Allergies  No prescriptions prior to admission    ROS Physical Exam   Blood pressure 130/72, pulse 90, temperature 98.4 F (36.9 C), temperature source Oral, resp. rate 20, weight 58.514 kg (129 lb), SpO2 98 %.  Physical Exam  Nursing note and vitals reviewed. Constitutional: She is oriented to person, place, and time. She appears well-developed and well-nourished. No distress.  Cardiovascular: Normal rate.   Respiratory: Effort normal.  GI: Soft. There is tenderness. There is no rebound and no guarding.  Neurological: She is alert and oriented to person, place, and time.  Skin: Skin is warm and dry.  Psychiatric: She has a normal mood and affect.    MAU Course  Procedures  Results for orders placed or performed during the hospital encounter of 03/24/14 (from the past 24 hour(s))  CBC     Status: Abnormal   Collection Time: 03/24/14  9:58 PM  Result Value Ref Range   WBC 11.5 (H) 4.0 - 10.5 K/uL   RBC 4.43 3.87 - 5.11 MIL/uL   Hemoglobin 13.2 12.0 - 15.0 g/dL   HCT 29.5 62.1 -  30.8 %   MCV 87.4 78.0 - 100.0 fL   MCH 29.8 26.0 - 34.0 pg   MCHC 34.1 30.0 - 36.0 g/dL   RDW 65.7 84.6 - 96.2 %   Platelets 264 150 - 400 K/uL  Basic metabolic panel     Status: Abnormal   Collection Time: 03/24/14  9:58 PM  Result Value Ref Range   Sodium 134 (L) 135 - 145 mmol/L   Potassium 3.7 3.5 - 5.1 mmol/L   Chloride 104 96 - 112 mmol/L   CO2 27 19 - 32 mmol/L   Glucose, Bld 95 70 - 99 mg/dL   BUN 11 6 - 23 mg/dL   Creatinine, Ser 9.52 0.50 - 1.10 mg/dL   Calcium 8.9 8.4 - 84.1 mg/dL   GFR calc non Af Amer >90 >90 mL/min   GFR calc Af Amer >90 >90 mL/min   Anion gap 3 (L) 5 - 15  Lipase, blood     Status: None   Collection Time: 03/24/14  9:58 PM  Result Value Ref Range   Lipase 41 11 - 59 U/L  Hepatic function panel     Status: None   Collection Time: 03/24/14  9:58 PM  Result Value Ref Range   Total Protein 7.1 6.0 - 8.3 g/dL   Albumin 4.1 3.5 - 5.2 g/dL  AST 21 0 - 37 U/L   ALT 10 0 - 35 U/L   Alkaline Phosphatase 45 39 - 117 U/L   Total Bilirubin 0.3 0.3 - 1.2 mg/dL   Bilirubin, Direct <7.5 0.0 - 0.5 mg/dL   Indirect Bilirubin NOT CALCULATED 0.3 - 0.9 mg/dL  Urinalysis, Routine w reflex microscopic     Status: Abnormal   Collection Time: 03/25/14 12:37 AM  Result Value Ref Range   Color, Urine YELLOW YELLOW   APPearance CLOUDY (A) CLEAR   Specific Gravity, Urine 1.033 (H) 1.005 - 1.030   pH 8.0 5.0 - 8.0   Glucose, UA NEGATIVE NEGATIVE mg/dL   Hgb urine dipstick NEGATIVE NEGATIVE   Bilirubin Urine NEGATIVE NEGATIVE   Ketones, ur >80 (A) NEGATIVE mg/dL   Protein, ur 30 (A) NEGATIVE mg/dL   Urobilinogen, UA 0.2 0.0 - 1.0 mg/dL   Nitrite NEGATIVE NEGATIVE   Leukocytes, UA NEGATIVE NEGATIVE  Urine microscopic-add on     Status: Abnormal   Collection Time: 03/25/14 12:37 AM  Result Value Ref Range   Squamous Epithelial / LPF RARE RARE   WBC, UA 0-2 <3 WBC/hpf   Bacteria, UA MANY (A) RARE   Urine-Other AMORPHOUS URATES/PHOSPHATES   POC Urine Pregnancy,  ED (do NOT order at [facility])     Status: None   Collection Time: 03/25/14 12:42 AM  Result Value Ref Range   Preg Test, Ur NEGATIVE NEGATIVE   US Transvaginal Non-ob  03/25/2014   CLINICAL DATA:  Pelvic pain.  EXAM: TRANSABDOMINAL AND TRANSVAGINAL ULTRASOUND OF PELVIS  DOPPLER ULTRASOUND OF OVARIES  TECHNIQUE: Both transabdominal and transvaginal ultrasound examinations of the pelvis were performed. Transabdominal technique was performed for global imaging of the pelvis including uterus, ovaries, adnexal regions, and pelvic cul-de-sac.  It was necessary to proceed with endovaginal exam following the transabdominal exam to visualize the ovaries. Color and duplex Doppler ultrasound was utilized to evaluate blood flow to the ovaries.  COMPARISON:  Abdominal CT from the same day  FINDINGS: Uterus  Measurements: 10 x 5 x 3 cm. No fibroids or other mass visualized.  Endometrium  There is an IUD which is in good position. No endometrial thickening.  Right ovary  Measurements: 3.4 x 1.7 x 2.1 cm. Normal appearance/no adnexal mass. Color Doppler flow was present.  Left ovary  8 x 6 x 8 cm, enlarged secondary to a 5.7 cm avascular mass which has mid level echoes and web like septation suggestive of fibrin strands. There is no color Doppler flow within the thickened surrounding ovarian parenchyma.  Critical Value/emergent results were called by telephone at the time of interpretation on 03/25/2014 at 5:43 am to Dr. Deanna Artis , who verbally acknowledged these results.  IMPRESSION: 1. Left ovarian torsion. 2. 5.8 cm probable hemorrhagic cyst in the left ovary. Followup imaging is recommended in 12 weeks.   Electronically Signed   By: Marnee Spring M.D.   On: 03/25/2014 05:43   US Pelvis Complete  03/25/2014   CLINICAL DATA:  Pelvic pain.  EXAM: TRANSABDOMINAL AND TRANSVAGINAL ULTRASOUND OF PELVIS  DOPPLER ULTRASOUND OF OVARIES  TECHNIQUE: Both transabdominal and transvaginal ultrasound examinations of the pelvis  were performed. Transabdominal technique was performed for global imaging of the pelvis including uterus, ovaries, adnexal regions, and pelvic cul-de-sac.  It was necessary to proceed with endovaginal exam following the transabdominal exam to visualize the ovaries. Color and duplex Doppler ultrasound was utilized to evaluate blood flow to the ovaries.  COMPARISON:  Abdominal  CT from the same day  FINDINGS: Uterus  Measurements: 10 x 5 x 3 cm. No fibroids or other mass visualized.  Endometrium  There is an IUD which is in good position. No endometrial thickening.  Right ovary  Measurements: 3.4 x 1.7 x 2.1 cm. Normal appearance/no adnexal mass. Color Doppler flow was present.  Left ovary  8 x 6 x 8 cm, enlarged secondary to a 5.7 cm avascular mass which has mid level echoes and web like septation suggestive of fibrin strands. There is no color Doppler flow within the thickened surrounding ovarian parenchyma.  Critical Value/emergent results were called by telephone at the time of interpretation on 03/25/2014 at 5:43 am to Dr. Deanna ArtisAPRIL PALUMBO-RASCH , who verbally acknowledged these results.  IMPRESSION: 1. Left ovarian torsion. 2. 5.8 cm probable hemorrhagic cyst in the left ovary. Followup imaging is recommended in 12 weeks.   Electronically Signed   By: Marnee SpringJonathon  Watts M.D.   On: 03/25/2014 05:43   Ct Abdomen Pelvis W Contrast  03/25/2014   CLINICAL DATA:  Abdominal pain.  Recent diagnosis of colitis.  EXAM: CT ABDOMEN AND PELVIS WITH CONTRAST  TECHNIQUE: Multidetector CT imaging of the abdomen and pelvis was performed using the standard protocol following bolus administration of intravenous contrast.  CONTRAST:  25mL OMNIPAQUE IOHEXOL 300 MG/ML SOLN, 100mL OMNIPAQUE IOHEXOL 300 MG/ML SOLN  COMPARISON:  None.  FINDINGS: BODY WALL: Unremarkable.  LOWER CHEST: Unremarkable.  ABDOMEN/PELVIS:  Liver: No focal abnormality.  Biliary: No evidence of biliary obstruction or stone.  Pancreas: Unremarkable.  Spleen:  Unremarkable.  Adrenals: Unremarkable.  Kidneys and ureters: No hydronephrosis or stone.  Bladder: Unremarkable.  Reproductive: There is a 5 cm cyst within the left ovary with the neighboring the ovary appearing low-density and expanded. IUD which is normally positioned.  Bowel: No obstruction. Normal appendix.  Retroperitoneum: No mass or adenopathy.  Peritoneum: No ascites or pneumoperitoneum.  Vascular: No acute abnormality.  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  IMPRESSION: 5 cm left ovarian cyst. Given left sided pain and edematous appearance of the surrounding ovary, recommend Doppler interrogation of ovarian blood flow.   Electronically Signed   By: Marnee SpringJonathon  Watts M.D.   On: 03/25/2014 01:51   [place] Art/ven Flow Abd Pelv Doppler  03/25/2014   CLINICAL DATA:  Pelvic pain.  EXAM: TRANSABDOMINAL AND TRANSVAGINAL ULTRASOUND OF PELVIS  DOPPLER ULTRASOUND OF OVARIES  TECHNIQUE: Both transabdominal and transvaginal ultrasound examinations of the pelvis were performed. Transabdominal technique was performed for global imaging of the pelvis including uterus, ovaries, adnexal regions, and pelvic cul-de-sac.  It was necessary to proceed with endovaginal exam following the transabdominal exam to visualize the ovaries. Color and duplex Doppler ultrasound was utilized to evaluate blood flow to the ovaries.  COMPARISON:  Abdominal CT from the same day  FINDINGS: Uterus  Measurements: 10 x 5 x 3 cm. No fibroids or other mass visualized.  Endometrium  There is an IUD which is in good position. No endometrial thickening.  Right ovary  Measurements: 3.4 x 1.7 x 2.1 cm. Normal appearance/no adnexal mass. Color Doppler flow was present.  Left ovary  8 x 6 x 8 cm, enlarged secondary to a 5.7 cm avascular mass which has mid level echoes and web like septation suggestive of fibrin strands. There is no color Doppler flow within the thickened surrounding ovarian parenchyma.  Critical Value/emergent results  were called by telephone at the time of interpretation on 03/25/2014 at 5:43 am to Dr. Morene AntuAPRIL  [facility] , who verbally acknowledged these results.  IMPRESSION: 1. Left ovarian torsion. 2. 5.8 cm probable hemorrhagic cyst in the left ovary. Followup imaging is recommended in 12 weeks.   Electronically Signed   By: Marnee Spring M.D.   On: 03/25/2014 05:43    1610: Dr. Shawnie Pons notified, and she will see the patient.  Assessment and Plan   1. Ovarian torsion   2. Pelvic pain in female     To the OR  Tawnya Crook 03/25/2014, 6:26 AM

## 2014-03-28 ENCOUNTER — Encounter (HOSPITAL_COMMUNITY): Payer: Self-pay | Admitting: Family Medicine

## 2014-04-08 ENCOUNTER — Ambulatory Visit (INDEPENDENT_AMBULATORY_CARE_PROVIDER_SITE_OTHER): Payer: Self-pay | Admitting: Obstetrics & Gynecology

## 2014-04-08 ENCOUNTER — Encounter: Payer: Self-pay | Admitting: Obstetrics & Gynecology

## 2014-04-08 VITALS — BP 115/58 | HR 71 | Temp 97.5°F | Ht 64.17 in | Wt 123.4 lb

## 2014-04-08 DIAGNOSIS — Z9889 Other specified postprocedural states: Secondary | ICD-10-CM

## 2014-04-08 NOTE — Progress Notes (Signed)
Subjective:     Patient ID: Meredith Gonzales, female   DOB: 01-15-1986, 29 y.o.   MRN: [id number]  HPIPt presents for 2 weeks post op check. She is without complaints.  She is performing her routine activities at home without difficulty.      Review of Systems     Objective:   Physical Exam BP 115/58 mmHg  Pulse 71  Temp(Src) 97.5 F (36.4 C) (Oral)  Ht 5' 4.17" (1.63 m)  Wt 123 lb 6.4 oz (55.974 kg)  BMI 21.07 kg/m2 Pt in NAD Abd: soft, NT, ND  Port sites healing well    03/25/2014 Diagnosis Ovary, cyst, right wall - BLOOD CLOT - NO ENDOMETRIOSIS, ATYPIA, OR MALIGNANCY.    Assessment:     2 week post op check - doing well    Plan:    f/u prn Return to full activities [name] used for Spanish interpreter

## 2014-04-08 NOTE — Patient Instructions (Signed)
Quistectoma ovrica: cuidados posteriores (Ovarian Cystectomy, Care After) Siga estas instrucciones durante las prximas semanas. Estas indicaciones le proporcionan informacin general acerca de cmo deber cuidarse despus del procedimiento. El mdico tambin podr darle instrucciones ms especficas. El tratamiento ha sido planificado segn las prcticas mdicas actuales, pero en algunos casos pueden ocurrir problemas. Comunquese con el mdico si tiene algn problema o tiene dudas despus del procedimiento.  QU ESPERAR DESPUS DEL PROCEDIMIENTO Despus del procedimiento, es comn tener las siguientes sensaciones:  Dolor en el abdomen, especialmente en el lugar de la incisin. Le darn analgsicos para Human resources officer.  Cansancio. Es Neomia Dear etapa normal del proceso de recuperacin. Su nivel de Chief Financial Officer a la normalidad te la prximas semanas.  Estreimiento INSTRUCCIONES PARA EL CUIDADO EN EL [employer] solo medicamentos de venta libre o recetados, segn las indicaciones del mdico. Evite tomar aspirina porque puede provocar hemorragia.  Siga las indicaciones del mdico sobre cundo debe retomar la dieta habitual, el ejercicio y [place].  Durante el da descanse cuanto sea necesario.  No se haga duchas vaginales ni tenga relaciones sexuales hasta que el mdico lo autorice.  Cambie o retire las vendas (vendajes) segn las indicaciones del mdico.  No conduzca vehculos hasta que el mdico lo autorice.  Tome Museum/gallery curator de baos de inmersin hasta que el mdico le indique otra cosa.  Si est estreida podr:  Tomar un laxante suave si el mdico se lo autoriza.  Agregar frutas y salvado a su dieta.  Beber ms lquidos.  Tmese la [employer] veces por da y Engineering geologist.  No beba alcohol si toma analgsicos.  Trate de que alguien la acompae en su casa durante una o dos semanas despus del procedimiento, para ayudarla con los quehaceres  domsticos.  Concurra a las consultas de control con su mdico segn las indicaciones. SOLICITE ATENCIN MDICA SI:  Lance Muss.  Tiene Programme researcher, broadcasting/film/video (nuseas) y vomita.  Observa enrojecimiento, hinchazn o prdida de lquido en el lugar de la incisin.  Siente dolor al orinar u observa sangre en la orina.  Tiene una erupcin cutnea en el cuerpo.  Tiene dolor o Microbiologist donde se coloc la va intravenosa (IV).  Siente un dolor que no se alivia con medicamentos. SOLICITE ATENCIN MDICA DE INMEDIATO SI:  Siente falta de aire o dolor en el pecho.  Se siente mareado o sufre un desmayo.  El dolor abdominal aumenta y no se alivia con medicamentos.  Siente dolor, u observa hinchazn o enrojecimiento en la pierna.  Anola Gurney una secrecin de color blanco amarillento (pus) en el lugar de la incisin.  La incisin se abre (los bordes no permanecen unidos). Document Released: 10/28/2012 [facility] Patient Information 2015 [place], [place]. This information is not intended to replace advice given to you by your health care provider. Make sure you discuss any questions you have with your health care provider. Quistectoma ovrica (Ovarian Cystectomy) La quistectoma ovrica es una ciruga que se realiza para extirpar una bolsa llena de lquido (quiste) de un ovario. Los ovarios son los rganos pequeos que producen vulos en las mujeres. Se pueden formar varios tipos de [employer]. Katha Hamming no son cancerosos. Posiblemente se realice una ciruga si el quiste es grande o causa sntomas como dolor. Tambin se puede Games developer caso de que el quiste sea canceroso o exista la posibilidad de que lo sea. Esta ciruga se puede realizar mediante una tcnica laparoscpica o una tcnica de abdomen  abierto. La tcnica laparoscpica involucra cortes ms pequeos (incisiones) y un proceso de recuperacin ms rpido. La tcnica que se use depender de la edad de la  [place], el tipo de quiste y si este es canceroso. La tcnica laparoscpica no se [place]ausa para quistes cancerosos. INFORME A SU MDICO:   Cualquier alergia que tenga.  Todos los Walt Disneymedicamentos que utiliza, incluidos vitaminas, hierbas, gotas oftlmicas, cremas y [address]icamentos de [address] libre.  Problemas previos que usted o los Graybar Electricmiembros de su familia hayan tenido con el uso de anestsicos.  Enfermedades de Clear Channel Communicationsla sangre.  Cirugas previas.  Enfermedades patolgicas.  Cualquier posibilidad de que pueda AES Corporationestar embarazada. RIESGOS Y COMPLICACIONES En general, se trata de un procedimiento seguro. Sin embargo, Tree surgeoncomo en cualquier procedimiento, pueden surgir complicaciones. Las complicaciones posibles son:  Sharlyne PacasSangrado excesivo.  Infeccin.  Lesiones en otros rganos.  Cogulos sanguneos.  Imposibilidad para quedar embarazada (infertilidad). ANTES DEL PROCEDIMIENTO  Consulte a su mdico si debe cambiar o suspender los medicamentos que toma habitualmente. Evite tomar aspirinas, ibuprofeno o anticoagulantes, segn las indicaciones del mdico.  No coma ni beba nada despus de la medianoche anterior a la ciruga.  Si fuma, no lo haga Barnes & Nobledurante las 2semanas previas a la Azerbaijanciruga, como mnimo.  No beba alcohol el da anterior a la Azerbaijanciruga.  Infrmele al mdico si contrae un resfro o alguna infeccin antes de la Azerbaijanciruga.  Pdale a alguien que lo lleve a su casa despus del procedimiento o de la hospitalizacin. Tambin pdale a alguna persona que lo ayude con sus actividades mientras se recupera. PROCEDIMIENTO  Para esta ciruga se puede usar tanto la tcnica laparoscpica como la tcnica de abdomen abierto.  Le colocarn pequeos monitores en el cuerpo. Estos controlarn su corazn, la presin arterial y Air cabin crewel nivel de oxgeno.  Se le colocar un acceso intravenoso en una de las venas. A travs de esta va intravenosa (IV) los medicamentos pasarn directamente hacia el cuerpo.  Es posible que le  administren un medicamento para ayudarlo a Lexicographerrelajarse (sedante).  Le administrarn un medicamento que la har dormir (anestesia general). Durante el procedimiento, posiblemente le coloquen un respirador. Tcnica laparoscpica  Le harn varios cortes pequeos (incisiones) en el abdomen. Normalmente tienen entre un 1,5 y 2 centmetros de longitud.  Se llenar el abdomen de gas de dixido de carbono para expandirlo. Esto permite que el cirujano tenga ms espacio para operar y Midwifefacilita la visualizacin de los rganos.  Le insertarn un tubo delgado luminoso, que tiene una pequea cmara en el extremo (laparoscopio) a travs de una de las pequeas incisiones. La cmara del laparoscopio enva una imagen a una pantalla de televisin que se encuentra en el quirfano. De este modo, el cirujano tendr Neomia Dearuna buena visin del interior del abdomen.  A travs de las otras pequeas incisiones del abdomen se insertan tubos huecos. A travs de estos tubos se coloca el instrumental necesario para los procedimientos.  Se identifica el ovario con el quiste y Shorelineeste se extirpa y se enva al laboratorio para que sea examinado. Si se trata de cncer, posiblemente se deban extirpar ambos ovarios en una ciruga diferente.  Se retiran los instrumentos y luego se cierran las incisiones con puntos o pegamento para la piel, y probablemente se coloquen vendajes. Tcnica de abdomen abierto  Se realiza una sola incisin grande a lo largo de la lnea del biquini o en el medio de la zona inferior del abdomen.  Se identifica el ovario con el quiste y [place] Parkeste se extirpa y  se enva al laboratorio para que sea examinado. Si se trata de cncer, posiblemente se deban extirpar ambos ovarios en una ciruga diferente.  Luego se cierra la incisin con puntos o grapas. DESPUS DEL PROCEDIMIENTO   Una vez que despierte de la anestesia la trasladarn a una sala de recuperacin.  Si se someti a Patent examiner, es posible que pueda  volver a su casa el mismo da del procedimiento, o puede Environmental health practitioner en observacin en el hospital durante la noche.  Si se someti a una ciruga abdominal, deber [employer].  Le retirarn la va intravenosa y el catter entre uno o [address] despus del procedimiento, una vez que pueda comer y beber lo suficiente.  Es posible que le den medicamentos para Engineer, materials o para que pueda dormir.  De ser necesario, probablemente le den antibiticos. Document Released: 10/28/2012 [facility] Patient Information 2015 [place], [place]. This information is not intended to replace advice given to you by your health care provider. Make sure you discuss any questions you have with your health care provider.

## 2016-01-07 ENCOUNTER — Ambulatory Visit (HOSPITAL_COMMUNITY)
Admission: EM | Admit: 2016-01-07 | Discharge: 2016-01-07 | Disposition: A | Discharge status: Home / Self Care | Payer: Self-pay | Attending: Family Medicine | Admitting: Family Medicine

## 2016-01-07 ENCOUNTER — Encounter (HOSPITAL_COMMUNITY): Payer: Self-pay | Admitting: *Deleted

## 2016-01-07 DIAGNOSIS — J069 Acute upper respiratory infection, unspecified: Secondary | ICD-10-CM

## 2016-01-07 DIAGNOSIS — B9789 Other viral agents as the cause of diseases classified elsewhere: Secondary | ICD-10-CM

## 2016-01-07 MED ORDER — HYDROCOD POLST-CPM POLST ER 10-8 MG/5ML PO SUER: 5.0000 mL | Freq: Two times a day (BID) | ORAL | 0 refills | Status: DC | PRN

## 2016-01-07 MED ORDER — IBUPROFEN 400 MG PO TABS: 400.0000 mg | ORAL_TABLET | Freq: Four times a day (QID) | ORAL | 0 refills | Status: AC | PRN

## 2016-01-07 NOTE — ED Provider Notes (Signed)
[facility]    CSN: [id number] Arrival date & time: 01/07/16  1407     History   Chief Complaint Chief Complaint  Patient presents with  . Cough    HPI Meredith BushmanConsuelo Gonzales is a 30 y.o. female.   The history is provided by the spouse. The history is limited by a language barrier.  Cough  Cough characteristics:  Productive Sputum characteristics:  Green Severity:  Moderate Onset quality:  Gradual Duration:  1 day Timing:  Intermittent Progression:  Waxing and waning Chronicity:  New Smoker: no   Context: not animal exposure, not occupational exposure, not sick contacts and not smoke exposure   Relieved by:  Nothing Worsened by:  Nothing Ineffective treatments:  None tried Associated symptoms: chest pain, headaches and myalgias   Associated symptoms: no ear pain, no eye discharge and no fever   Positive postnasal drainage. She got flu shot in Oct.  Past Medical History:  Diagnosis Date  . PONV (postoperative nausea and vomiting)     Patient Active Problem List   Diagnosis Date Noted  . Ovarian torsion     Past Surgical History:  Procedure Laterality Date  . CESAREAN SECTION    . LAPAROSCOPY Right 03/25/2014   Procedure: LAPAROSCOPY OPERATIVE with removal of right ovarian cyst wall;  Surgeon: Reva Boresanya S Pratt, MD;  Location: [facility];  Service: Gynecology;  Laterality: Right;    OB History    Gravida Para Term Preterm AB Living   2 2 2     2    SAB TAB Ectopic Multiple Live Births                   Home Medications    Prior to Admission medications   Not on File    Family History No family history on file.  Social History Social History  Substance Use Topics  . Smoking status: Former Games developermoker  . Smokeless tobacco: Not on file  . Alcohol use No     Allergies   Patient has no known allergies.   Review of Systems Review of Systems  Constitutional: Negative for fever.  HENT: Negative for ear pain.   Eyes: Negative for discharge.    Respiratory: Positive for cough.   Cardiovascular: Positive for chest pain.  Musculoskeletal: Positive for myalgias.  Neurological: Positive for headaches.  All other systems reviewed and are negative.    Physical Exam Triage Vital Signs ED Triage Vitals  Enc Vitals Group     BP 01/07/16 1544 121/92     Pulse Rate 01/07/16 1544 102     Resp 01/07/16 1544 16     Temp 01/07/16 1544 98.2 F (36.8 C)     Temp Source 01/07/16 1544 Oral     SpO2 01/07/16 1544 100 %     Weight --      Height --      Head Circumference --      Peak Flow --      Pain Score 01/07/16 1546 8     Pain Loc --      Pain Edu? --      Excl. in GC? --    No data found.   Updated Vital Signs BP 121/92   Pulse 102   Temp 98.2 F (36.8 C) (Oral)   Resp 16   SpO2 100%   Visual Acuity Right Eye Distance:   Left Eye Distance:   Bilateral Distance:    Right Eye Near:   Left Eye  Near:    Bilateral Near:     Physical Exam  Constitutional: She is oriented to person, place, and time. She appears well-developed. No distress.  HENT:  Head: Normocephalic.  Right Ear: External ear normal.  Left Ear: External ear normal.  Nose: Nose normal.  Mouth/Throat: Oropharynx is clear and moist. No oropharyngeal exudate.  Eyes: Conjunctivae are normal. Right eye exhibits no discharge. Left eye exhibits no discharge.  Cardiovascular: Normal rate, regular rhythm and normal heart sounds.   No murmur heard. Pulmonary/Chest: Effort normal and breath sounds normal. No respiratory distress. She has no wheezes. She has no rales.  Lymphadenopathy:    She has no cervical adenopathy.  Neurological: She is alert and oriented to person, place, and time.  Vitals reviewed.    UC Treatments / Results  Labs (all labs ordered are listed, but only abnormal results are displayed) Labs Reviewed - No data to display  EKG  EKG Interpretation None       Radiology No results found.  Procedures Procedures (including  critical care time)  Medications Ordered in UC Medications - No data to display   Initial Impression / Assessment and Plan / UC Course  I have reviewed the triage vital signs and the nursing notes.  Pertinent labs & imaging results that were available during my care of the patient were reviewed by me and considered in my medical decision making (see chart for details).  Clinical Course as of Jan 07 1615  Sun Jan 07, 2016  1614 Upper resp tract infection. Patient reassured antibiotic is not needed. Tussionex prescribed prn cough. Use Ibuprofen as needed for pain. Rest at home and keep self well hydrated. F/U soon if symptoms persist.  [KE]    Clinical Course User Index [KE] Doreene ElandKehinde T Graden Hoshino, MD      Final Clinical Impressions(s) / UC Diagnoses   Final diagnoses:  None   Viral URI with cough   New Prescriptions New Prescriptions   No medications on file     Doreene ElandKehinde T Nevin Grizzle, MD 01/07/16 1617

## 2016-01-07 NOTE — ED Notes (Signed)
On discharge from department, patient/family member were voicing concerns in regards to not receiving antibiotic script.  Tried to explain.  Offered to get provider, patient/family member agreed.  Notified dr Lum Babeeniola.

## 2016-01-07 NOTE — ED Triage Notes (Signed)
C/O nasal congestion, cough, greenish nasal discharge, chest pain and SOB x 4 days.  Has not been taking any medicines at home.

## 2016-01-07 NOTE — Discharge Instructions (Signed)
It was nice to see you. It sounds like you have viral upper respiratory tract infection. Antibiotic is not needed for this. Please take cough medication as prescribed. Rest at home and keep well hydrated. Call if symptoms worsens.

## 2017-10-24 ENCOUNTER — Emergency Department (HOSPITAL_COMMUNITY)
Admission: EM | Admit: 2017-10-24 | Discharge: 2017-10-25 | Disposition: A | Discharge status: Home / Self Care | Payer: Self-pay | Attending: Emergency Medicine | Admitting: Emergency Medicine

## 2017-10-24 ENCOUNTER — Other Ambulatory Visit: Payer: Self-pay

## 2017-10-24 ENCOUNTER — Emergency Department (HOSPITAL_COMMUNITY): Payer: Self-pay

## 2017-10-24 ENCOUNTER — Encounter (HOSPITAL_COMMUNITY): Payer: Self-pay | Admitting: Emergency Medicine

## 2017-10-24 DIAGNOSIS — Z87891 Personal history of nicotine dependence: Secondary | ICD-10-CM | POA: Insufficient documentation

## 2017-10-24 DIAGNOSIS — Z79899 Other long term (current) drug therapy: Secondary | ICD-10-CM | POA: Insufficient documentation

## 2017-10-24 DIAGNOSIS — F410 Panic disorder [episodic paroxysmal anxiety] without agoraphobia: Secondary | ICD-10-CM | POA: Insufficient documentation

## 2017-10-24 LAB — BASIC METABOLIC PANEL
Anion gap: 10 (ref 5–15)
BUN: 10 mg/dL (ref 6–20)
CO2: 25 mmol/L (ref 22–32)
Calcium: 9.5 mg/dL (ref 8.9–10.3)
Chloride: 104 mmol/L (ref 98–111)
Creatinine, Ser: 0.64 mg/dL (ref 0.44–1.00)
GFR calc non Af Amer: >60 mL/min (ref >60)
Glucose, Bld: 84 mg/dL (ref 70–99)
Potassium: 3.3 mmol/L — ABNORMAL LOW (ref 3.5–5.1)
Sodium: 139 mmol/L (ref 135–145)

## 2017-10-24 LAB — I-STAT TROPONIN, ED: Troponin i, poc: 0 ng/mL (ref 0.00–0.08)

## 2017-10-24 LAB — CBC
HEMATOCRIT: 41.6 % (ref 36.0–46.0)
Hemoglobin: 13.8 g/dL (ref 12.0–15.0)
MCH: 29.5 pg (ref 26.0–34.0)
MCHC: 33.2 g/dL (ref 30.0–36.0)
MCV: 88.9 fL (ref 78.0–100.0)
Platelets: 321 10*3/uL (ref 150–400)
RBC: 4.68 MIL/uL (ref 3.87–5.11)
RDW: 12.8 % (ref 11.5–15.5)
WBC: 10.6 10*3/uL — AB (ref 4.0–10.5)

## 2017-10-24 LAB — I-STAT BETA HCG BLOOD, ED (MC, WL, AP ONLY): I-stat hCG, quantitative: <5 m[IU]/mL (ref <5)

## 2017-10-24 NOTE — ED Triage Notes (Signed)
C/o intermittent numbness across forehead and SOB since Sunday.  Reports pain to center of chest x 30 min.  Went to PCP today because she felt anxious and felt like she was going to die.  States she is scared to go to sleep.  Reports hands feeling cold at home, nausea, and vomited x 1.  No nausea at present.

## 2017-10-25 MED ORDER — HYDROXYZINE HCL 25 MG PO TABS: 25.0000 mg | ORAL_TABLET | Freq: Four times a day (QID) | ORAL | 0 refills | Status: DC

## 2017-10-25 MED ORDER — HYDROXYZINE HCL 25 MG PO TABS: 25.0000 mg | ORAL_TABLET | Freq: Three times a day (TID) | ORAL | 0 refills | Status: DC | PRN

## 2017-10-25 NOTE — Discharge Instructions (Signed)
Take the prescribed medication as directed-- take this when you feel anxious.  If you do not need it, you do not have to take it. Follow-up with your primary care doctor. Return to the ED for new or worsening symptoms.

## 2017-10-25 NOTE — ED Provider Notes (Signed)
[facility] EMERGENCY DEPARTMENT Provider Note   CSN: [id number] Arrival date & time: 10/24/17  2046     History   Chief Complaint Chief Complaint  Patient presents with  . Chest Pain    HPI Meredith Gonzales is a 32 y.o. female.  The history is provided by the patient and medical records.  Chest Pain       32 year old female presenting to the ED with concern of panic attack.  History was obtained from patient's husband and which interpreter as patient primarily Spanish-speaking.  States today around 4 PM she felt very nervous and uneasy.  States she has been having cold sensations to her hands, numbness and tingling of her face, nausea, and fear of going to sleep for the past few days.  States today symptoms got worse and she went to her primary care doctor.  States she was told if she did not come down she was going to "get Bell's palsy".  States this made her panic even more.  Her doctor gave her some breathing exercises to try, she and her husband went for a walk and started feeling better.  She has never had diagnosis of anxiety or experience panic attack before.  She denies any chest pain or shortness of breath.  States she did vomit one time today and noticed a "blood vessel" in her left eye.  Husband reports they have a 45-year-old son with Down syndrome and patient has expressed that she is very worried that something may happen to her and their "son will be left alone".  Also reports a family member who is 25 suddenly died in his sleep last week without known cause.  This caused patient to worry about her own health even more.  She does not have any ongoing medical problems.  By time of my evaluation, patient was able to drift off to sleep and states she is feeling much better.  Past Medical History:  Diagnosis Date  . PONV (postoperative nausea and vomiting)     Patient Active Problem List   Diagnosis Date Noted  . Ovarian torsion     Past Surgical  History:  Procedure Laterality Date  . CESAREAN SECTION    . LAPAROSCOPY Right 03/25/2014   Procedure: LAPAROSCOPY OPERATIVE with removal of right ovarian cyst wall;  Surgeon: Reva Bores, MD;  Location: [facility];  Service: Gynecology;  Laterality: Right;     OB History    Gravida  2   Para  2   Term  2   Preterm      AB      Living  2     SAB      TAB      Ectopic      Multiple      Live Births               Home Medications    Prior to Admission medications   Medication Sig Start Date End Date Taking? Authorizing Provider  naproxen (NAPROSYN) 500 MG tablet Take 500 mg by mouth 2 (two) times daily with a meal.   Yes [provider]  chlorpheniramine-HYDROcodone (TUSSIONEX PENNKINETIC ER) 10-8 MG/5ML SUER Take 5 mLs by mouth every 12 (twelve) hours as needed for cough. Do not use when driving or operating any machinery Patient not taking: Reported on 10/25/2017 01/07/16   Doreene Eland, MD    Family History No family history on file.  Social History Social History  Tobacco Use  . Smoking status: Former Games developer  . Smokeless tobacco: Never Used  Substance Use Topics  . Alcohol use: No  . Drug use: No     Allergies   Patient has no known allergies.   Review of Systems Review of Systems  Psychiatric/Behavioral: The patient is nervous/anxious.   All other systems reviewed and are negative.    Physical Exam Updated Vital Signs BP 111/70   Pulse 61   Temp 98.6 F (37 C) (Oral)   Resp 18   Ht 5' 4.17" (1.63 m)   Wt 67.1 kg   SpO2 100%   BMI 25.27 kg/m   Physical Exam  Constitutional: She is oriented to person, place, and time. She appears well-developed and well-nourished. No distress.  Initially sleeping, awake for exam, NAD  HENT:  Head: Normocephalic and atraumatic.  Right Ear: External ear normal.  Left Ear: External ear normal.  Mouth/Throat: Oropharynx is clear and moist.  Eyes: Pupils are equal, round, and reactive  to light. Conjunctivae and EOM are normal.  Subconjunctival hemorrhage left eye  Neck: Normal range of motion and full passive range of motion without pain. Neck supple. No neck rigidity.  No rigidity, no meningismus  Cardiovascular: Normal rate, regular rhythm and normal heart sounds.  No murmur heard. Pulmonary/Chest: Effort normal and breath sounds normal. No stridor. No respiratory distress. She has no wheezes. She has no rhonchi.  Abdominal: Soft. Bowel sounds are normal. There is no tenderness. There is no rebound and no guarding.  Musculoskeletal: Normal range of motion. She exhibits no edema.  Neurological: She is alert and oriented to person, place, and time. She has normal strength. She displays no tremor. No cranial nerve deficit or sensory deficit. She displays no seizure activity.  AAOx3, answering questions and following commands appropriately; equal strength UE and LE bilaterally; CN grossly intact; moves all extremities appropriately without ataxia; symmetric forehead wrinkle and smile, no facial droop  Skin: Skin is warm and dry. No rash noted. She is not diaphoretic.  Psychiatric: She has a normal mood and affect. Her behavior is normal. Thought content normal.  Nursing note and vitals reviewed.    ED Treatments / Results  Labs (all labs ordered are listed, but only abnormal results are displayed) Labs Reviewed  BASIC METABOLIC PANEL - Abnormal; Notable for the following components:      Result Value   Potassium 3.3 (*)    All other components within normal limits  CBC - Abnormal; Notable for the following components:   WBC 10.6 (*)    All other components within normal limits  I-STAT TROPONIN, ED  I-STAT BETA HCG BLOOD, ED ([facility], [facility], [facility] ONLY)    EKG EKG Interpretation  Date/Time:  Friday October 24 2017 21:06:06 EDT Ventricular Rate:  75 PR Interval:  136 QRS Duration: 98 QT Interval:  380 QTC Calculation: 424 R Axis:   31 Text Interpretation:  Normal sinus  rhythm Incomplete right bundle branch block Borderline ECG No previous ECGs available Confirmed by Glynn Octave [phone]) on 10/24/2017 11:43:30 PM   Radiology Dg Chest 2 View  Result Date: 10/24/2017 CLINICAL DATA:  Numbness across forehead.  Shortness of breath. EXAM: CHEST - 2 VIEW COMPARISON:  None. FINDINGS: The heart size and mediastinal contours are within normal limits. Both lungs are clear. The visualized skeletal structures are unremarkable. IMPRESSION: No active cardiopulmonary disease. Electronically Signed   By: Gerome Sam III M.D   On: 10/24/2017 21:26    Procedures  Procedures (including critical care time)  Medications Ordered in ED Medications - No data to display   Initial Impression / Assessment and Plan / ED Course  I have reviewed the triage vital signs and the nursing notes.  Pertinent labs & imaging results that were available during my care of the patient were reviewed by me and considered in my medical decision making (see chart for details).  32 year old female here after a panic attack.  States this happened today, seen by her primary care doctor who mentioned "Bell's palsy" which made her even more worried.  By time of my evaluation she has calmed down, resting comfortably.  Triage note reports chest pain, however patient denies this to me.  States she got nauseated, vomited, felt some numbness and tingling across her face, and her hands felt very cold.  EKG is nonischemic.  Labs reassuring.  Chest x-ray is clear.  She does not have any facial asymmetry, symmetric smile and forehead wrinkle.  Normal sensation throughout her face and all of her extremities.  She does not have any evidence of bell's palsy or other neurologic condition on my exam here today.  She does have a left subconjunctival hemorrhage noted which is likely from vomiting.    Patient's husband reports they have a 18-year-old son with Down syndrome who patient cares for primarily.  She does not  have a lot of family in the area to rely on or talk to.  States they also recently had a death in the family last week, 36 year old cousin died unexpectedly in his sleep.  Patient does admit this has been on her mind recently.  I suspect her symptoms today are due to anxiety, multiple possible sources.   She was able to calm herself down today with deep breathing and taking walk with husband, however is concerned about "what if" this happens again and is very nervous about this.  I have prescribed her some vistaril to take PRN.  Recommend that she follow-up closely with her PCP.  She will return here for any new/acute changes.  Final Clinical Impressions(s) / ED Diagnoses   Final diagnoses:  Panic attack    ED Discharge Orders         Ordered         hydrOXYzine (ATARAX/VISTARIL) 25 MG tablet  3 times daily PRN     10/25/17 0140           Garlon Hatchet, PA-C 10/25/17 1610    Geoffery Lyons, MD 10/25/17 0630

## 2017-10-25 NOTE — ED Notes (Signed)
Patient is spanish speaking, interpreter service being used.

## 2017-12-02 ENCOUNTER — Ambulatory Visit (INDEPENDENT_AMBULATORY_CARE_PROVIDER_SITE_OTHER): Payer: Self-pay | Admitting: Physician Assistant

## 2020-08-03 ENCOUNTER — Encounter (HOSPITAL_COMMUNITY): Payer: Self-pay | Admitting: Obstetrics & Gynecology

## 2020-08-03 ENCOUNTER — Inpatient Hospital Stay (HOSPITAL_COMMUNITY)
Admission: AD | Admit: 2020-08-03 | Discharge: 2020-08-03 | Disposition: A | Discharge status: Home / Self Care | Payer: Self-pay | Attending: Obstetrics & Gynecology | Admitting: Obstetrics & Gynecology

## 2020-08-03 ENCOUNTER — Other Ambulatory Visit: Payer: Self-pay

## 2020-08-03 DIAGNOSIS — R6883 Chills (without fever): Secondary | ICD-10-CM | POA: Insufficient documentation

## 2020-08-03 DIAGNOSIS — Z79899 Other long term (current) drug therapy: Secondary | ICD-10-CM | POA: Insufficient documentation

## 2020-08-03 DIAGNOSIS — R300 Dysuria: Secondary | ICD-10-CM | POA: Insufficient documentation

## 2020-08-03 DIAGNOSIS — R102 Pelvic and perineal pain: Secondary | ICD-10-CM | POA: Insufficient documentation

## 2020-08-03 DIAGNOSIS — Z87891 Personal history of nicotine dependence: Secondary | ICD-10-CM | POA: Insufficient documentation

## 2020-08-03 DIAGNOSIS — Z3A01 Less than 8 weeks gestation of pregnancy: Secondary | ICD-10-CM | POA: Insufficient documentation

## 2020-08-03 DIAGNOSIS — O2341 Unspecified infection of urinary tract in pregnancy, first trimester: Secondary | ICD-10-CM

## 2020-08-03 DIAGNOSIS — O26891 Other specified pregnancy related conditions, first trimester: Secondary | ICD-10-CM | POA: Insufficient documentation

## 2020-08-03 LAB — URINALYSIS, MICROSCOPIC (REFLEX): WBC, UA: >50 WBC/hpf (ref 0–5)

## 2020-08-03 LAB — URINALYSIS, ROUTINE W REFLEX MICROSCOPIC
Bilirubin Urine: NEGATIVE
Glucose, UA: 100 mg/dL — AB
Ketones, ur: NEGATIVE mg/dL
Nitrite: POSITIVE — AB
Protein, ur: NEGATIVE mg/dL
Specific Gravity, Urine: <1.005 — ABNORMAL LOW (ref 1.005–1.030)
pH: 6 (ref 5.0–8.0)

## 2020-08-03 LAB — POCT PREGNANCY, URINE: Preg Test, Ur: POSITIVE — AB

## 2020-08-03 MED ORDER — NITROFURANTOIN MONOHYD MACRO 100 MG PO CAPS
100.0000 mg | ORAL_CAPSULE | Freq: Once | ORAL | Status: AC
  Administered 2020-08-03: 100 mg via ORAL
  Filled 2020-08-03: qty 1

## 2020-08-03 MED ORDER — NITROFURANTOIN MONOHYD MACRO 100 MG PO CAPS: 100.0000 mg | ORAL_CAPSULE | Freq: Two times a day (BID) | ORAL | 0 refills | Status: AC

## 2020-08-03 NOTE — MAU Provider Note (Signed)
History     [id number]  Arrival date and time: 08/03/20 1225    Chief Complaint  Patient presents with   Dysuria   Chills     HPI Meredith Gonzales is a 35 y.o. at [redacted]w[redacted]d by LMP, who presents for urinary concern.   Patient reports she has been having three days of foul smelling urine with burning and pain with urination Denies fever, nausea, vomiting, back or flank pain Similar to when she has had a uti in the past She was going to see her PCP earlier today for this issues but was directed to the MAU due to her pregnancy Denies vaginal bleeding Mild suprapubic pain      OB History     Gravida  3   Para  2   Term  2   Preterm      AB      Living  2      SAB      IAB      Ectopic      Multiple      Live Births              Past Medical History:  Diagnosis Date   PONV (postoperative nausea and vomiting)     Past Surgical History:  Procedure Laterality Date   CESAREAN SECTION     LAPAROSCOPY Right 03/25/2014   Procedure: LAPAROSCOPY OPERATIVE with removal of right ovarian cyst wall;  Surgeon: Reva Bores, MD;  Location: [facility];  Service: Gynecology;  Laterality: Right;    No family history on file.  Social History   Socioeconomic History   Marital status: Married    Spouse name: Not on file   Number of children: Not on file   Years of education: Not on file   Highest education level: Not on file  Occupational History   Not on file  Tobacco Use   Smoking status: Former   Smokeless tobacco: Never  Substance and Sexual Activity   Alcohol use: No   Drug use: No   Sexual activity: Not on file  Other Topics Concern   Not on file  Social History Narrative   Not on file   Social Determinants of Health   Financial Resource Strain: Not on file  Food Insecurity: Not on file  Transportation Needs: Not on file  Physical Activity: Not on file  Stress: Not on file  Social Connections: Not on file  Intimate Partner Violence: Not on  file    No Known Allergies  No current facility-administered medications on file prior to encounter.   Current Outpatient Medications on File Prior to Encounter  Medication Sig Dispense Refill   chlorpheniramine-HYDROcodone (TUSSIONEX PENNKINETIC ER) 10-8 MG/5ML SUER Take 5 mLs by mouth every 12 (twelve) hours as needed for cough. Do not use when driving or operating any machinery (Patient not taking: Reported on 10/25/2017) 115 mL 0   hydrOXYzine (ATARAX/VISTARIL) 25 MG tablet Take 1 tablet (25 mg total) by mouth 3 (three) times daily as needed for anxiety. 12 tablet 0   naproxen (NAPROSYN) 500 MG tablet Take 500 mg by mouth 2 (two) times daily with a meal.       ROS Pertinent positives and negative per HPI, all others reviewed and negative  Physical Exam   BP 120/82 (BP Location: Right Arm)   Pulse 87   Temp 97.8 F (36.6 C) (Oral)   Resp 20   Ht 5' 5.5" (1.664 m)   Wt 67  kg   SpO2 100%   BMI 24.20 kg/m   Patient Vitals for the past 24 hrs:  BP Temp Temp src Pulse Resp SpO2 Height Weight  08/03/20 1305 120/82 97.8 F (36.6 C) Oral 87 20 100 % 5' 5.5" (1.664 m) 67 kg    Physical Exam Vitals reviewed.  Constitutional:      General: She is not in acute distress.    Appearance: She is well-developed. She is not diaphoretic.  Eyes:     General: No scleral icterus. Pulmonary:     Effort: Pulmonary effort is normal. No respiratory distress.  Abdominal:     Palpations: Abdomen is soft.     Tenderness: There is no abdominal tenderness. There is no guarding or rebound.     Comments: Mild suprapubic tenderness to palpation  Skin:    General: Skin is warm and dry.  Neurological:     Mental Status: She is alert.     Coordination: Coordination normal.     Cervical Exam    Bedside Ultrasound Not done  My interpretation: n/a  FHT Too early  Labs Results for orders placed or performed during the hospital encounter of 08/03/20 (from the past 24 hour(s))   Pregnancy, urine POC     Status: Abnormal   Collection Time: 08/03/20  1:18 PM  Result Value Ref Range   Preg Test, Ur POSITIVE (A) NEGATIVE    Imaging No results found.  MAU Course  Procedures Lab Orders  Urinalysis, Routine w reflex microscopic Urine, Clean Catch  Pregnancy, urine POC  Meds ordered this encounter  Medications   nitrofurantoin (macrocrystal-monohydrate) (MACROBID) capsule 100 mg   nitrofurantoin, macrocrystal-monohydrate, (MACROBID) 100 MG capsule    Sig: Take 1 capsule (100 mg total) by mouth 2 (two) times daily for 7 days.    Dispense:  13 capsule    Refill:  0   Imaging Orders  No imaging studies ordered today    MDM mild  Assessment and Plan  #UTI in pregnancy Symptoms consistent with uncomplicated UTI in pregnancy. No suspicion for pyelo at this time based on symptoms and normal vital signs. Will treat with macrobid, reviewed warning signs for which she should return, to start prenatal care [facility] at [facility].  Discharged to home in stable condition.    Venora Maples, MD/MPH 08/03/20 1:59 PM  Allergies as of 08/03/2020   No Known Allergies      Medication List     STOP taking these medications    chlorpheniramine-HYDROcodone 10-8 MG/5ML Suer Commonly known as: Tussionex Pennkinetic ER   naproxen 500 MG tablet Commonly known as: NAPROSYN       TAKE these medications    hydrOXYzine 25 MG tablet Commonly known as: ATARAX/VISTARIL Take 1 tablet (25 mg total) by mouth 3 (three) times daily as needed for anxiety.   nitrofurantoin (macrocrystal-monohydrate) 100 MG capsule Commonly known as: MACROBID Take 1 capsule (100 mg total) by mouth 2 (two) times daily for 7 days.

## 2020-08-03 NOTE — MAU Note (Signed)
Frequency, urgency and pain with urination. Only goes a few drops.  Started 3 days.   Taking OTC AZO, started 2 days ago.  Having chills.  Called HD- was told to come here. Pt is pregnant.  Has been to HD

## 2020-08-25 DIAGNOSIS — N3001 Acute cystitis with hematuria: Secondary | ICD-10-CM | POA: Insufficient documentation

## 2020-09-07 ENCOUNTER — Inpatient Hospital Stay (HOSPITAL_COMMUNITY)
Admission: AD | Admit: 2020-09-07 | Discharge: 2020-09-07 | Disposition: A | Discharge status: Home / Self Care | Payer: Medicaid Other | Attending: Family Medicine | Admitting: Family Medicine

## 2020-09-07 ENCOUNTER — Other Ambulatory Visit: Payer: Self-pay

## 2020-09-07 ENCOUNTER — Inpatient Hospital Stay (HOSPITAL_COMMUNITY): Payer: Medicaid Other

## 2020-09-07 ENCOUNTER — Encounter (HOSPITAL_COMMUNITY): Payer: Self-pay | Admitting: Obstetrics and Gynecology

## 2020-09-07 DIAGNOSIS — R300 Dysuria: Secondary | ICD-10-CM | POA: Insufficient documentation

## 2020-09-07 DIAGNOSIS — Z79899 Other long term (current) drug therapy: Secondary | ICD-10-CM | POA: Diagnosis not present

## 2020-09-07 DIAGNOSIS — O99341 Other mental disorders complicating pregnancy, first trimester: Secondary | ICD-10-CM | POA: Diagnosis not present

## 2020-09-07 DIAGNOSIS — O039 Complete or unspecified spontaneous abortion without complication: Secondary | ICD-10-CM

## 2020-09-07 DIAGNOSIS — O209 Hemorrhage in early pregnancy, unspecified: Secondary | ICD-10-CM | POA: Insufficient documentation

## 2020-09-07 DIAGNOSIS — F32A Depression, unspecified: Secondary | ICD-10-CM | POA: Diagnosis not present

## 2020-09-07 DIAGNOSIS — O26891 Other specified pregnancy related conditions, first trimester: Secondary | ICD-10-CM | POA: Insufficient documentation

## 2020-09-07 DIAGNOSIS — Z87440 Personal history of urinary (tract) infections: Secondary | ICD-10-CM | POA: Insufficient documentation

## 2020-09-07 DIAGNOSIS — O3680X0 Pregnancy with inconclusive fetal viability, not applicable or unspecified: Secondary | ICD-10-CM

## 2020-09-07 DIAGNOSIS — Z3A10 10 weeks gestation of pregnancy: Secondary | ICD-10-CM | POA: Diagnosis not present

## 2020-09-07 HISTORY — DX: Urinary tract infection, site not specified: N39.0

## 2020-09-07 HISTORY — DX: Depression, unspecified: F32.A

## 2020-09-07 HISTORY — DX: Unspecified ectopic pregnancy without intrauterine pregnancy: O00.90

## 2020-09-07 HISTORY — DX: Diffuse cystic mastopathy of unspecified breast: N60.19

## 2020-09-07 LAB — URINALYSIS, ROUTINE W REFLEX MICROSCOPIC
Bilirubin Urine: NEGATIVE
Glucose, UA: NEGATIVE mg/dL
Hgb urine dipstick: NEGATIVE
Ketones, ur: NEGATIVE mg/dL
Leukocytes,Ua: NEGATIVE
Nitrite: NEGATIVE
Protein, ur: NEGATIVE mg/dL
Specific Gravity, Urine: 1.025 (ref 1.005–1.030)
pH: 6 (ref 5.0–8.0)

## 2020-09-07 LAB — CBC
HCT: 35.9 % — ABNORMAL LOW (ref 36.0–46.0)
Hemoglobin: 12.5 g/dL (ref 12.0–15.0)
MCH: 30.9 pg (ref 26.0–34.0)
MCHC: 34.8 g/dL (ref 30.0–36.0)
MCV: 88.6 fL (ref 80.0–100.0)
Platelets: 318 10*3/uL (ref 150–400)
RBC: 4.05 MIL/uL (ref 3.87–5.11)
RDW: 12.3 % (ref 11.5–15.5)
WBC: 8.9 10*3/uL (ref 4.0–10.5)
nRBC: 0 % (ref 0.0–0.2)

## 2020-09-07 LAB — TYPE AND SCREEN
ABO/RH(D): AB POS
Antibody Screen: NEGATIVE

## 2020-09-07 LAB — HCG, QUANTITATIVE, PREGNANCY: hCG, Beta Chain, Quant, S: 4504 m[IU]/mL — ABNORMAL HIGH (ref <5)

## 2020-09-07 MED ORDER — PROMETHAZINE HCL 25 MG PO TABS: 25.0000 mg | ORAL_TABLET | Freq: Four times a day (QID) | ORAL | 0 refills | Status: DC | PRN

## 2020-09-07 MED ORDER — IBUPROFEN 600 MG PO TABS: 600.0000 mg | ORAL_TABLET | Freq: Four times a day (QID) | ORAL | 0 refills | Status: DC | PRN

## 2020-09-07 MED ORDER — OXYCODONE-ACETAMINOPHEN 5-325 MG PO TABS: 1.0000 | ORAL_TABLET | Freq: Four times a day (QID) | ORAL | 0 refills | Status: DC | PRN

## 2020-09-07 MED ORDER — MISOPROSTOL 200 MCG PO TABS
800.0000 ug | ORAL_TABLET | Freq: Once | ORAL | Status: AC
  Administered 2020-09-07: 800 ug via BUCCAL
  Filled 2020-09-07: qty 4

## 2020-09-07 NOTE — MAU Note (Signed)
Scant pale lt brown/tan mucous- only when she wipes, first noted yesterday. Pain in lower abd, cramping, very very light- started last night.  Had some nausea. Was dx with UTI last week, on meds for 12 days. (pt found to be not taking correctly)

## 2020-09-07 NOTE — MAU Provider Note (Addendum)
History     [id number]  Arrival date and time: 09/07/20 0825    Chief Complaint  Patient presents with   Vaginal Bleeding   Abdominal Pain     HPI Meredith Gonzales is a 35 y.o. at [redacted]w[redacted]d by LMP with PMHx notable for two prior cesareans, who presents for vaginal bleeding.   Patient reports vaginal bleeding since last night, mostly brownish discharge when she wipes after going to the bathroom Also having strong cramps similar to a period in her lower abdomen Endorses nausea but no significant vomiting Denies fever Endorses urinary burning and pain as well as urgency Some hip pain but no back pain Was seen recently at [facility] and prescribed Keflex QID which she has been taking TID No large gush of fluid per vagina  --/--/AB POS (08/18 0950)  OB History     Gravida  4   Para  2   Term  2   Preterm      AB  1   Living  1      SAB      IAB      Ectopic  1   Multiple      Live Births  2        Obstetric Comments  Both had cord around the neck.  2nd child had downs syndrome, past away from Cancer 2020         Past Medical History:  Diagnosis Date   Breast changes, fibrocystic    Depression    Ectopic pregnancy    UTI (urinary tract infection)     Past Surgical History:  Procedure Laterality Date   CESAREAN SECTION     LAPAROSCOPIC ABDOMINAL EXPLORATION     removal of ectopic pregnancy   LAPAROSCOPY Right 03/25/2014   Procedure: LAPAROSCOPY OPERATIVE with removal of right ovarian cyst wall;  Surgeon: Reva Bores, MD;  Location: [facility];  Service: Gynecology;  Laterality: Right;    Family History  Problem Relation Age of Onset   Heart disease Mother        pacemaker   Diabetes Mother    Healthy Father    Cancer Son    Down syndrome Son     Social History   Socioeconomic History   Marital status: Married    Spouse name: Not on file   Number of children: Not on file   Years of education: Not on file   Highest education level: Not  on file  Occupational History   Not on file  Tobacco Use   Smoking status: Never   Smokeless tobacco: Never  Vaping Use   Vaping Use: Never used  Substance and Sexual Activity   Alcohol use: No   Drug use: No   Sexual activity: Yes  Other Topics Concern   Not on file  Social History Narrative   Not on file   Social Determinants of Health   Financial Resource Strain: Not on file  Food Insecurity: Not on file  Transportation Needs: Not on file  Physical Activity: Not on file  Stress: Not on file  Social Connections: Not on file  Intimate Partner Violence: Not on file    No Known Allergies  No current facility-administered medications on file prior to encounter.   Current Outpatient Medications on File Prior to Encounter  Medication Sig Dispense Refill   cephALEXin (KEFLEX) 500 MG capsule Take 500 mg by mouth 4 (four) times daily.     Prenatal Vit-Fe Fumarate-FA (PRENATAL VITAMINS  PO) Take by mouth. 'one a day"     sertraline (ZOLOFT) 50 MG tablet Take 50 mg by mouth daily.     hydrOXYzine (ATARAX/VISTARIL) 25 MG tablet Take 1 tablet (25 mg total) by mouth 3 (three) times daily as needed for anxiety. 12 tablet 0     ROS Pertinent positives and negative per HPI, all others reviewed and negative  Physical Exam   BP 136/75 (BP Location: Right Arm)   Pulse 66   Temp 97.8 F (36.6 C) (Oral)   Resp 18   Ht 5' 5.5" (1.664 m)   Wt 68.4 kg   BMI 24.73 kg/m   Patient Vitals for the past 24 hrs:  BP Temp Temp src Pulse Resp Height Weight  09/07/20 0838 136/75 97.8 F (36.6 C) Oral 66 18 5' 5.5" (1.664 m) 68.4 kg    Physical Exam Vitals reviewed.  Constitutional:      General: She is not in acute distress.    Appearance: She is well-developed. She is not diaphoretic.  Eyes:     General: No scleral icterus. Pulmonary:     Effort: Pulmonary effort is normal. No respiratory distress.  Abdominal:     General: There is no distension.     Palpations: Abdomen is  soft.     Tenderness: There is abdominal tenderness in the suprapubic area. There is no guarding or rebound.  Skin:    General: Skin is warm and dry.  Neurological:     Mental Status: She is alert.     Coordination: Coordination normal.     Cervical Exam    Bedside Ultrasound Pt informed that the ultrasound is considered a limited OB ultrasound and is not intended to be a complete ultrasound exam.  Patient also informed that the ultrasound is not being completed with the intent of assessing for fetal or placental anomalies or any pelvic abnormalities.  Explained that the purpose of today's ultrasound is to assess for  viability.  Patient acknowledges the purpose of the exam and the limitations of the study.    My interpretation: IUGS seen and possible fetal pole. GS with irregular contour.   Labs Results for orders placed or performed during the hospital encounter of 09/07/20 (from the past 24 hour(s))  Urinalysis, Routine w reflex microscopic OB Clean Catch     Status: Abnormal   Collection Time: 09/07/20  9:46 AM  Result Value Ref Range   Color, Urine YELLOW YELLOW   APPearance HAZY (A) CLEAR   Specific Gravity, Urine 1.025 1.005 - 1.030   pH 6.0 5.0 - 8.0   Glucose, UA NEGATIVE NEGATIVE mg/dL   Hgb urine dipstick NEGATIVE NEGATIVE   Bilirubin Urine NEGATIVE NEGATIVE   Ketones, ur NEGATIVE NEGATIVE mg/dL   Protein, ur NEGATIVE NEGATIVE mg/dL   Nitrite NEGATIVE NEGATIVE   Leukocytes,Ua NEGATIVE NEGATIVE  CBC     Status: Abnormal   Collection Time: 09/07/20  9:50 AM  Result Value Ref Range   WBC 8.9 4.0 - 10.5 K/uL   RBC 4.05 3.87 - 5.11 MIL/uL   Hemoglobin 12.5 12.0 - 15.0 g/dL   HCT 40.935.9 (L) 81.136.0 - 91.446.0 %   MCV 88.6 80.0 - 100.0 fL   MCH 30.9 26.0 - 34.0 pg   MCHC 34.8 30.0 - 36.0 g/dL   RDW 78.212.3 95.611.5 - 21.315.5 %   Platelets 318 150 - 400 K/uL   nRBC 0.0 0.0 - 0.2 %  hCG, quantitative, pregnancy     Status: Abnormal  Collection Time: 09/07/20  9:50 AM  Result Value  Ref Range   hCG, Beta Chain, Quant, S 4,504 (H) <5 mIU/mL  Type and screen [facility]     Status: None   Collection Time: 09/07/20  9:50 AM  Result Value Ref Range   ABO/RH(D) AB POS    Antibody Screen NEG    Sample Expiration      09/10/2020,2359 Performed at [facility], [address], [place], [place] [address]     Imaging US OB LESS THAN 14 WEEKS WITH OB TRANSVAGINAL  Result Date: 09/07/2020 CLINICAL DATA:  Pregnancy of unknown location EXAM: OBSTETRIC <14 WK ULTRASOUND TECHNIQUE: Transabdominal ultrasound was performed for evaluation of the gestation as well as the maternal uterus and adnexal regions. COMPARISON:  None. FINDINGS: Intrauterine gestational sac: Single, irregularly shaped Yolk sac:  Not Visualized. Embryo:  Not Visualized. Cardiac Activity: Not Visualized. MSD:  24.6 mm   7 w   3 d Subchorionic hemorrhage:  None visualized. Maternal uterus/adnexae: The right ovary is unremarkable. There is a corpus luteum on the left. IMPRESSION: Single intrauterine gestational sac measuring 49mm with no yolk sac or an embryo identified. Findings meet definitive criteria for failed pregnancy. This follows SRU consensus guidelines: Diagnostic Criteria for Nonviable Pregnancy Early in the First Trimester. [facility] Mis J Med [phone]. Electronically Signed   By: Lesia Hausen M.D.   On: 09/07/2020 10:46    MAU Course  Procedures Lab Orders         Culture, OB Urine         CBC         hCG, quantitative, pregnancy         Urinalysis, Routine w reflex microscopic    Meds ordered this encounter  Medications   misoprostol (CYTOTEC) tablet 800 mcg   Imaging Orders         US OB LESS THAN 14 WEEKS WITH OB TRANSVAGINAL     MDM moderate  Assessment and Plan  #Vaginal bleeding in pregnancy, first trimester Patient presenting with approximately half day of light vaginal spotting and abdominal pain. On BSUS there is an IUGS that is irregularly shaped without any  other clear structures. Will send for [place] and basic labs. Discussed with patient that I have a strong suspicion this may be a failed pregnancy based on BSUS image findings.   10:35 AM  US shows findings definitive for failed pregnancy. Patient understandably sad about news. Many questions. Discussed most common cause is undiagnosed genetic abnormalities. Stressed patient has not done, or failed to do, anything that caused this. Reviewed studies have not shown optimal time for trying to get pregnant again, though some studies show improved live birth rates with trying again sooner (within 3 months) rather than later (>6 months).   Discussed options with patient of expectant management vs misoprostol vs D&C. She elects for misoprostol prior to discharge. Discussed anticipatory guidance, increased bleeding and cramping to be expected. Reviewed return precautions of fever, crescendo abdominal pain, heavy vaginal bleeding requiring >1 pad/hour, symptoms of dizziness/light headedness/chest pain/shortness of breath. Given meds for symptom management.  Discharged to home in stable condition.  Will message clinic to set up SAB follow up appt.   #Dysuria Unclear cause of symptoms given clinical picture. Will await Ucx prior to further treatment.  Venora Maples, MD/MPH 09/07/20 11:31 AM  Allergies as of 09/07/2020   No Known Allergies      Medication List  TAKE these medications    cephALEXin 500 MG capsule Commonly known as: KEFLEX Take 500 mg by mouth 4 (four) times daily.   hydrOXYzine 25 MG tablet Commonly known as: ATARAX/VISTARIL Take 1 tablet (25 mg total) by mouth 3 (three) times daily as needed for anxiety.   ibuprofen 600 MG tablet Commonly known as: ADVIL Take 1 tablet (600 mg total) by mouth every 6 (six) hours as needed.   oxyCODONE-acetaminophen 5-325 MG tablet Commonly known as: PERCOCET/ROXICET Take 1 tablet by mouth every 6 (six) hours as needed for severe pain.    PRENATAL VITAMINS PO Take by mouth. 'one a day"   promethazine 25 MG tablet Commonly known as: PHENERGAN Take 1 tablet (25 mg total) by mouth every 6 (six) hours as needed for nausea or vomiting.   sertraline 50 MG tablet Commonly known as: ZOLOFT Take 50 mg by mouth daily.

## 2020-09-08 LAB — CULTURE, OB URINE: Culture: <10000 — AB

## 2020-09-09 ENCOUNTER — Inpatient Hospital Stay (HOSPITAL_COMMUNITY): Payer: Medicaid Other

## 2020-09-09 ENCOUNTER — Other Ambulatory Visit: Payer: Self-pay

## 2020-09-09 ENCOUNTER — Encounter (HOSPITAL_COMMUNITY): Payer: Self-pay | Admitting: Obstetrics and Gynecology

## 2020-09-09 ENCOUNTER — Inpatient Hospital Stay (HOSPITAL_COMMUNITY)
Admission: AD | Admit: 2020-09-09 | Discharge: 2020-09-09 | Disposition: A | Discharge status: Home / Self Care | Payer: Medicaid Other | Attending: Obstetrics and Gynecology | Admitting: Obstetrics and Gynecology

## 2020-09-09 DIAGNOSIS — R519 Headache, unspecified: Secondary | ICD-10-CM | POA: Insufficient documentation

## 2020-09-09 DIAGNOSIS — O26891 Other specified pregnancy related conditions, first trimester: Secondary | ICD-10-CM

## 2020-09-09 DIAGNOSIS — O039 Complete or unspecified spontaneous abortion without complication: Secondary | ICD-10-CM | POA: Diagnosis not present

## 2020-09-09 DIAGNOSIS — O09292 Supervision of pregnancy with other poor reproductive or obstetric history, second trimester: Secondary | ICD-10-CM | POA: Insufficient documentation

## 2020-09-09 DIAGNOSIS — Z6790 Unspecified blood type, Rh positive: Secondary | ICD-10-CM

## 2020-09-09 DIAGNOSIS — O4690 Antepartum hemorrhage, unspecified, unspecified trimester: Secondary | ICD-10-CM

## 2020-09-09 DIAGNOSIS — Z3A14 14 weeks gestation of pregnancy: Secondary | ICD-10-CM | POA: Insufficient documentation

## 2020-09-09 DIAGNOSIS — Z886 Allergy status to analgesic agent status: Secondary | ICD-10-CM | POA: Diagnosis not present

## 2020-09-09 DIAGNOSIS — Z79899 Other long term (current) drug therapy: Secondary | ICD-10-CM | POA: Diagnosis not present

## 2020-09-09 DIAGNOSIS — M549 Dorsalgia, unspecified: Secondary | ICD-10-CM | POA: Diagnosis not present

## 2020-09-09 DIAGNOSIS — O26892 Other specified pregnancy related conditions, second trimester: Secondary | ICD-10-CM | POA: Insufficient documentation

## 2020-09-09 DIAGNOSIS — R109 Unspecified abdominal pain: Secondary | ICD-10-CM | POA: Diagnosis not present

## 2020-09-09 DIAGNOSIS — O99891 Other specified diseases and conditions complicating pregnancy: Secondary | ICD-10-CM | POA: Diagnosis not present

## 2020-09-09 LAB — HCG, QUANTITATIVE, PREGNANCY: hCG, Beta Chain, Quant, S: 1799 m[IU]/mL — ABNORMAL HIGH (ref <5)

## 2020-09-09 MED ORDER — OXYCODONE-ACETAMINOPHEN 5-325 MG PO TABS: 2.0000 | ORAL_TABLET | Freq: Once | ORAL | Status: DC

## 2020-09-09 MED ORDER — OXYCODONE HCL 5 MG PO TABS
5.0000 mg | ORAL_TABLET | Freq: Once | ORAL | Status: AC
  Administered 2020-09-09: 5 mg via ORAL
  Filled 2020-09-09: qty 1

## 2020-09-09 MED ORDER — ACETAMINOPHEN 500 MG PO TABS
1000.0000 mg | ORAL_TABLET | Freq: Once | ORAL | Status: AC
  Administered 2020-09-09: 1000 mg via ORAL
  Filled 2020-09-09: qty 2

## 2020-09-09 MED ORDER — OXYCODONE HCL 5 MG PO TABS: 5.0000 mg | ORAL_TABLET | Freq: Three times a day (TID) | ORAL | 0 refills | Status: DC | PRN

## 2020-09-09 NOTE — MAU Provider Note (Signed)
History     CSN: [id number]  Arrival date and time: 09/09/20 1217   Event Date/Time   First Provider Initiated Contact with Patient 09/09/20 1322      Chief Complaint  Patient presents with   Abdominal Pain   Back Pain   Headache   Vaginal Bleeding   [name] is a 35 y.o. G4P2011 at 835w6d who presents to MAU for abdominal pain, headache, back pain and vaginal bleeding.  Patient reports she was told to expect contractions about 6 hours after taking the cytotec medication, but states the bleeding was less than she was expecting. Patient states compared to a period, the bleeding was the same as a menstrual period and she did not experience large clots or pass a gestational sac. Patient states she had some small clots and small pieces, but states that she did not have anything that was large.  Patient states she is experiencing 10/10 abdominal pain and back pain is constant that started yesterday night. Patient also reports 10/10 headache that started 2 days ago and was present when she was in MAU two days ago. Patient states she told the provider who told her the headache was normal. Patient states she was told if the headache continued to return to MAU. Patient states she was told she was [redacted] weeks pregnant and was given three options for the resolution of the failed pregnancy, but was advised not to choose the D&C.  Patient reports she has taken ibuprofen 600mg  around 9AM this morning, which did not help her pain. Patient states she did not take any Tylenol or Percocet this morning. Patient states she did take a Percocet last night, but that it did not help her. Patient reports she has been taking the medications on a schedule and states she has been taking the medication every 6 hours.  Patient came to the hospital for evaluation because she is afraid that parts of the pregnancy are still left inside and that she might have an infection and a cousin of hers died from uterine  infection after a miscarriage.  Pt denies vaginal discharge/odor/itching. Pt denies N/V, constipation, diarrhea, or urinary problems. Pt denies fever, chills, fatigue, sweating or changes in appetite. Pt denies SOB or chest pain. Pt denies dizziness, HA, light-headedness, weakness.  Spanish translation used for entire visit.  Problems this pregnancy include: failed pregnancy, given cytotec 09/07/2020. Allergies? NKDA Current medications/supplements? Ibuprofen, Percocet   OB History     Gravida  4   Para  2   Term  2   Preterm      AB  1   Living  1      SAB      IAB      Ectopic  1   Multiple      Live Births  2        Obstetric Comments  Both had cord around the neck.  2nd child had downs syndrome, past away from Cancer 2020         Past Medical History:  Diagnosis Date   Breast changes, fibrocystic    Depression    Ectopic pregnancy    UTI (urinary tract infection)     Past Surgical History:  Procedure Laterality Date   CESAREAN SECTION     LAPAROSCOPIC ABDOMINAL EXPLORATION     removal of ectopic pregnancy   LAPAROSCOPY Right 03/25/2014   Procedure: LAPAROSCOPY OPERATIVE with removal of right ovarian cyst wall;  Surgeon: Reva Boresanya S Pratt,  MD;  Location: [facility];  Service: Gynecology;  Laterality: Right;    Family History  Problem Relation Age of Onset   Heart disease Mother        pacemaker   Diabetes Mother    Healthy Father    Cancer Son    Down syndrome Son     Social History   Tobacco Use   Smoking status: Never   Smokeless tobacco: Never  Vaping Use   Vaping Use: Never used  Substance Use Topics   Alcohol use: No   Drug use: No    Allergies: No Known Allergies  Medications Prior to Admission  Medication Sig Dispense Refill Last Dose   ibuprofen (ADVIL) 600 MG tablet Take 1 tablet (600 mg total) by mouth every 6 (six) hours as needed. 30 tablet 0 09/09/2020 at 0900   cephALEXin (KEFLEX) 500 MG capsule Take 500 mg by mouth  4 (four) times daily.      hydrOXYzine (ATARAX/VISTARIL) 25 MG tablet Take 1 tablet (25 mg total) by mouth 3 (three) times daily as needed for anxiety. 12 tablet 0    oxyCODONE-acetaminophen (PERCOCET/ROXICET) 5-325 MG tablet Take 1 tablet by mouth every 6 (six) hours as needed for severe pain. 10 tablet 0    Prenatal Vit-Fe Fumarate-FA (PRENATAL VITAMINS PO) Take by mouth. 'one a day"      promethazine (PHENERGAN) 25 MG tablet Take 1 tablet (25 mg total) by mouth every 6 (six) hours as needed for nausea or vomiting. 30 tablet 0    sertraline (ZOLOFT) 50 MG tablet Take 50 mg by mouth daily.       Review of Systems  Constitutional:  Negative for chills, diaphoresis, fatigue and fever.  Eyes:  Negative for visual disturbance.  Respiratory:  Negative for shortness of breath.   Cardiovascular:  Negative for chest pain.  Gastrointestinal:  Positive for abdominal pain. Negative for constipation, diarrhea, nausea and vomiting.  Genitourinary:  Positive for vaginal bleeding. Negative for dysuria, flank pain, frequency, pelvic pain, urgency and vaginal discharge.  Musculoskeletal:  Positive for back pain.  Neurological:  Positive for headaches. Negative for dizziness, weakness and light-headedness.   Physical Exam   Blood pressure 134/84, pulse 94, temperature 98.2 F (36.8 C), temperature source Oral, resp. rate 20, SpO2 98 %.  Patient Vitals for the past 24 hrs:  BP Temp Temp src Pulse Resp SpO2  09/09/20 1238 134/84 98.2 F (36.8 C) Oral 94 20 98 %   Physical Exam Vitals and nursing note reviewed. Exam conducted with a chaperone present.  Constitutional:      General: She is not in acute distress.    Appearance: Normal appearance. She is not ill-appearing, toxic-appearing or diaphoretic.  HENT:     Head: Normocephalic and atraumatic.  Pulmonary:     Effort: Pulmonary effort is normal.  Genitourinary:    General: Normal vulva.     Labia:        Right: No rash, tenderness or lesion.         Left: No rash, tenderness or lesion.      Vagina: Normal.     Cervix: Normal.     Comments: Minimal dark red, thick blood present at os that appears not to be dilated. No membranes visible. Neurological:     Mental Status: She is alert and oriented to person, place, and time.  Psychiatric:        Mood and Affect: Mood normal.        Behavior:  Behavior normal.        Thought Content: Thought content normal.        Judgment: Judgment normal.   Results for orders placed or performed during the hospital encounter of 09/09/20 (from the past 24 hour(s))  hCG, quantitative, pregnancy     Status: Abnormal   Collection Time: 09/09/20  3:48 PM  Result Value Ref Range   hCG, Beta Chain, Quant, S 1,799 (H) <5 mIU/mL   US OB Transvaginal  Result Date: 09/09/2020 CLINICAL DATA:  Vaginal bleeding in a 36 year old female with positive pregnancy, beta hCG of 4,500, gestational age by last menstrual period of 10 weeks and 6 days. EXAM: TRANSVAGINAL OB ULTRASOUND TECHNIQUE: Transvaginal ultrasound was performed for complete evaluation of the gestation as well as the maternal uterus, adnexal regions, and pelvic cul-de-sac. COMPARISON:  Comparison made with examination from September 07, 2020. FINDINGS: Intrauterine gestational sac: Single Yolk sac: Not definitively visualized, subtle area along the margin of the gestational sac may represent tiny yolk sac. Embryo: Not definitively visualized. 1.7 mm area adjacent to potential yolk sac. Cardiac Activity: Not Visualized. MSD: 26.8 mm   7 w   4 d CRL: 1.7 mm this area is not definitive for small embryo. Estimated gestational age not possible given small size. Subchorionic hemorrhage:  None visualized. Maternal uterus/adnexae: Since the prior study there is been interval migration of the presumed gestational sac into the lower uterine segment. It is more oblong than on the previous exam. No signs of free fluid in the pelvis or visible adnexal mass. Ovaries with  normal appearance. IMPRESSION: Constellation of findings that suggest abortion in progress of a failed pregnancy as outlined previously. Confounding factor on the current study is suggestion of small yolk sac versus debris along the margin of the gestational sac. No definitive evidence of embryo is identified. Continued beta HCG correlation and close follow-up are suggested with ultrasound as warranted. Electronically Signed   By: Donzetta Kohut M.D.   On: 09/09/2020 15:14   US OB LESS THAN 14 WEEKS WITH OB TRANSVAGINAL  Result Date: 09/07/2020 CLINICAL DATA:  Pregnancy of unknown location EXAM: OBSTETRIC <14 WK ULTRASOUND TECHNIQUE: Transabdominal ultrasound was performed for evaluation of the gestation as well as the maternal uterus and adnexal regions. COMPARISON:  None. FINDINGS: Intrauterine gestational sac: Single, irregularly shaped Yolk sac:  Not Visualized. Embryo:  Not Visualized. Cardiac Activity: Not Visualized. MSD:  24.6 mm   7 w   3 d Subchorionic hemorrhage:  None visualized. Maternal uterus/adnexae: The right ovary is unremarkable. There is a corpus luteum on the left. IMPRESSION: Single intrauterine gestational sac measuring 65mm with no yolk sac or an embryo identified. Findings meet definitive criteria for failed pregnancy. This follows SRU consensus guidelines: Diagnostic Criteria for Nonviable Pregnancy Early in the First Trimester. [facility] Mis J Med [phone]. Electronically Signed   By: Lesia Hausen M.D.   On: 09/07/2020 10:46    MAU Course  Procedures  MDM -dx failed pregnancy -10/10 HA, abdominal pain, back pain completely resolved with Tylenol and oxycodone in MAU -[place]: likely SAB in process, [place] report and results discussed at length with Dr. Donavan Foil and [place] Radiologist -per Dr. Donavan Foil, if hCG drops, is SAB in process and can offer Cytotec again or D&C -hCG today 1799, greater than 50% decrease from 48 hours ago -pt does not desire additional administration of Cytotec today,  preferring to wait for [facility] -Dr. Donavan Foil sent message to scheduling to call patient for [facility] -pt advised  SAB is in process and will likely not make it to the weekdays for D&C, but we will happily schedule her, pt aware and agrees with plan -pt discharged to home in stable condition  Orders Placed This Encounter  Procedures   US OB Transvaginal    Standing Status:   Standing    Number of Occurrences:   1    Order Specific Question:   Symptom/Reason for Exam    Answer:   Vaginal bleeding in pregnancy [[id number]]   hCG, quantitative, pregnancy    Standing Status:   Standing    Number of Occurrences:   1   Discharge patient    Order Specific Question:   Discharge disposition    Answer:   01-Home or Self Care [1]    Order Specific Question:   Discharge patient date    Answer:   09/09/2020   Meds ordered this encounter  Medications   DISCONTD: oxyCODONE-acetaminophen (PERCOCET/ROXICET) 5-325 MG per tablet 2 tablet   oxyCODONE (Oxy IR/ROXICODONE) immediate release tablet 5 mg   acetaminophen (TYLENOL) tablet 1,000 mg   oxyCODONE (ROXICODONE) 5 MG immediate release tablet    Sig: Take 1 tablet (5 mg total) by mouth every 8 (eight) hours as needed.    Dispense:  10 tablet    Refill:  0    Order Specific Question:   Supervising Provider    Answer:   Mariel Aloe A [[id number]]   Assessment and Plan   1. Miscarriage   2. Vaginal bleeding in pregnancy   3. Blood type, Rh positive   4. Pregnancy headache in first trimester     Allergies as of 09/09/2020   No Known Allergies      Medication List     STOP taking these medications    oxyCODONE-acetaminophen 5-325 MG tablet Commonly known as: PERCOCET/ROXICET       TAKE these medications    cephALEXin 500 MG capsule Commonly known as: KEFLEX Take 500 mg by mouth 4 (four) times daily.   hydrOXYzine 25 MG tablet Commonly known as: ATARAX/VISTARIL Take 1 tablet (25 mg total) by mouth 3 (three) times daily as needed for anxiety.    ibuprofen 600 MG tablet Commonly known as: ADVIL Take 1 tablet (600 mg total) by mouth every 6 (six) hours as needed.   oxyCODONE 5 MG immediate release tablet Commonly known as: Roxicodone Take 1 tablet (5 mg total) by mouth every 8 (eight) hours as needed.   PRENATAL VITAMINS PO Take by mouth. 'one a day"   promethazine 25 MG tablet Commonly known as: PHENERGAN Take 1 tablet (25 mg total) by mouth every 6 (six) hours as needed for nausea or vomiting.   sertraline 50 MG tablet Commonly known as: ZOLOFT Take 50 mg by mouth daily.       -pt advised not to take Vistaril and Oxycodone concurrently -discussed scheduled medications, alternating Tylenol and ibuprofen every 3 hours, taking each medication every 6 -pt advised to not take Percocet given that it also contains Tylenol, and to instead take new RX for just Oxycodone as needed for breakthrough pain -message sent to [facility] to schedule f/u for SAB -return MAU precautions given -pt discharged to home in stable condition  Joni Reining E Sarahi Borland 09/09/2020, 6:12 PM

## 2020-09-09 NOTE — MAU Note (Signed)
Meredith Gonzales is a 35 y.o. at [redacted]w[redacted]d here in MAU reporting: received cytotec 2 days ago. Yesterday started having increased abdominal and back pain and also a headache. Has had a small amount of bleeding with small clots.   Pt has daughter with her and is trying to find someone to come get her.  Onset of complaint: yesterday  Pain score: abdominal pain 10/10, back pain 10/10, headache 10/10  Vitals:   09/09/20 1238  BP: 134/84  Pulse: 94  Resp: 20  Temp: 98.2 F (36.8 C)  SpO2: 98%     Lab orders placed from triage: none

## 2020-09-11 ENCOUNTER — Telehealth: Payer: Self-pay

## 2020-09-11 ENCOUNTER — Encounter (HOSPITAL_COMMUNITY): Payer: Self-pay | Admitting: Obstetrics & Gynecology

## 2020-09-11 ENCOUNTER — Other Ambulatory Visit (HOSPITAL_BASED_OUTPATIENT_CLINIC_OR_DEPARTMENT_OTHER): Payer: Self-pay | Admitting: Obstetrics & Gynecology

## 2020-09-11 NOTE — Telephone Encounter (Signed)
Called patient 3 times using pacific interpreters, no answer, voicemail box not set up unable to leave a message

## 2020-09-11 NOTE — Progress Notes (Signed)
Spanish speaking patient.  Used [employer] ID# [phone] for PAT information and instructions for DOS.  DUE TO COVID-19 ONLY ONE VISITOR IS ALLOWED TO COME WITH YOU AND STAY IN THE WAITING ROOM ONLY DURING PRE OP AND PROCEDURE DAY OF SURGERY.   PCP - [facility] Cardiologist - n/a  Chest x-ray - n/a EKG - n/a Stress Test - n/a ECHO - n/a Cardiac Cath - n/a  Sleep Study -  n/a CPAP - n/a  ERAS: Clear liquids til 11 am on day of surgery. Reviewed clear liquids with patient.  STOP now taking any Aspirin (unless otherwise instructed by your surgeon), Aleve, Naproxen, Ibuprofen, Motrin, Advil, Goody's, BC's, all herbal medications, fish oil, and all vitamins.   Coronavirus Screening Covid test n/a - Ambulatory Surgery  Do you have any of the following symptoms:  Cough yes/no: No Fever (>100.7F)  yes/no: No Runny nose yes/no: No Sore throat yes/no: No Difficulty breathing/shortness of breath  yes/no: No  Have you traveled in the last 14 days and where? yes/no: No  Patient verbalized understanding of instructions that were given via Spanish Interpreter via phone.

## 2020-09-12 ENCOUNTER — Ambulatory Visit (HOSPITAL_COMMUNITY)
Admission: RE | Admit: 2020-09-12 | Discharge: 2020-09-12 | Disposition: A | Discharge status: Home / Self Care | Payer: Medicaid Other | Attending: Obstetrics & Gynecology | Admitting: Obstetrics & Gynecology

## 2020-09-12 ENCOUNTER — Encounter (HOSPITAL_COMMUNITY)
Admission: RE | Disposition: A | Discharge status: Home / Self Care | Payer: Self-pay | Source: Home / Self Care | Attending: Obstetrics & Gynecology

## 2020-09-12 ENCOUNTER — Encounter (HOSPITAL_COMMUNITY): Payer: Self-pay | Admitting: Obstetrics & Gynecology

## 2020-09-12 ENCOUNTER — Other Ambulatory Visit: Payer: Self-pay

## 2020-09-12 ENCOUNTER — Ambulatory Visit (HOSPITAL_COMMUNITY): Payer: Medicaid Other | Admitting: Certified Registered Nurse Anesthetist

## 2020-09-12 DIAGNOSIS — Z884 Allergy status to anesthetic agent status: Secondary | ICD-10-CM | POA: Insufficient documentation

## 2020-09-12 DIAGNOSIS — Z79899 Other long term (current) drug therapy: Secondary | ICD-10-CM | POA: Diagnosis not present

## 2020-09-12 DIAGNOSIS — O021 Missed abortion: Secondary | ICD-10-CM | POA: Diagnosis not present

## 2020-09-12 HISTORY — DX: Other complications of anesthesia, initial encounter: T88.59XA

## 2020-09-12 HISTORY — PX: DILATION AND EVACUATION: SHX1459

## 2020-09-12 LAB — TYPE AND SCREEN
ABO/RH(D): AB POS
Antibody Screen: NEGATIVE

## 2020-09-12 SURGERY — DILATION AND EVACUATION, UTERUS
Anesthesia: General | Site: Vagina

## 2020-09-12 MED ORDER — DEXAMETHASONE SODIUM PHOSPHATE 10 MG/ML IJ SOLN
INTRAMUSCULAR | Status: AC
  Filled 2020-09-12: qty 1

## 2020-09-12 MED ORDER — PHENYLEPHRINE 40 MCG/ML (10ML) SYRINGE FOR IV PUSH (FOR BLOOD PRESSURE SUPPORT)
PREFILLED_SYRINGE | INTRAVENOUS | Status: AC
  Filled 2020-09-12: qty 10

## 2020-09-12 MED ORDER — PROPOFOL 10 MG/ML IV BOLUS
INTRAVENOUS | Status: DC | PRN
  Administered 2020-09-12: 200 mg via INTRAVENOUS

## 2020-09-12 MED ORDER — SCOPOLAMINE 1 MG/3DAYS TD PT72
1.0000 | MEDICATED_PATCH | TRANSDERMAL | Status: DC
  Administered 2020-09-12: 1.5 mg via TRANSDERMAL

## 2020-09-12 MED ORDER — KETOROLAC TROMETHAMINE 30 MG/ML IJ SOLN
INTRAMUSCULAR | Status: DC | PRN
  Administered 2020-09-12: 30 mg via INTRAVENOUS

## 2020-09-12 MED ORDER — IBUPROFEN 800 MG PO TABS: 800.0000 mg | ORAL_TABLET | Freq: Three times a day (TID) | ORAL | 0 refills | Status: DC | PRN

## 2020-09-12 MED ORDER — ACETAMINOPHEN 500 MG PO TABS
ORAL_TABLET | ORAL | Status: AC
  Administered 2020-09-12: 1000 mg via ORAL
  Filled 2020-09-12: qty 2

## 2020-09-12 MED ORDER — LIDOCAINE 2% (20 MG/ML) 5 ML SYRINGE
INTRAMUSCULAR | Status: DC | PRN
Start: 2020-09-12 — End: 2020-09-12
  Administered 2020-09-12: 40 mg via INTRAVENOUS

## 2020-09-12 MED ORDER — MIDAZOLAM HCL 2 MG/2ML IJ SOLN
INTRAMUSCULAR | Status: AC
  Filled 2020-09-12: qty 2

## 2020-09-12 MED ORDER — CHLORHEXIDINE GLUCONATE 0.12 % MT SOLN
OROMUCOSAL | Status: AC
  Administered 2020-09-12: 15 mL via OROMUCOSAL
  Filled 2020-09-12: qty 15

## 2020-09-12 MED ORDER — STERILE WATER FOR IRRIGATION IR SOLN
Status: DC | PRN
  Administered 2020-09-12: 1000 mL

## 2020-09-12 MED ORDER — LIDOCAINE-EPINEPHRINE 1 %-1:100000 IJ SOLN
INTRAMUSCULAR | Status: DC | PRN
  Administered 2020-09-12: 10 mL

## 2020-09-12 MED ORDER — LACTATED RINGERS IV SOLN: INTRAVENOUS | Status: DC | PRN

## 2020-09-12 MED ORDER — LIDOCAINE-EPINEPHRINE 1 %-1:100000 IJ SOLN
INTRAMUSCULAR | Status: AC
  Filled 2020-09-12: qty 1

## 2020-09-12 MED ORDER — HYDROCODONE-ACETAMINOPHEN 5-325 MG PO TABS: 1.0000 | ORAL_TABLET | Freq: Four times a day (QID) | ORAL | 0 refills | Status: DC | PRN

## 2020-09-12 MED ORDER — ORAL CARE MOUTH RINSE: 15.0000 mL | Freq: Once | OROMUCOSAL | Status: AC

## 2020-09-12 MED ORDER — ACETAMINOPHEN 500 MG PO TABS: 1000.0000 mg | ORAL_TABLET | Freq: Once | ORAL | Status: AC

## 2020-09-12 MED ORDER — ACETAMINOPHEN 500 MG PO TABS
ORAL_TABLET | ORAL | Status: AC
  Filled 2020-09-12: qty 1

## 2020-09-12 MED ORDER — LACTATED RINGERS IV SOLN: INTRAVENOUS | Status: DC

## 2020-09-12 MED ORDER — CHLORHEXIDINE GLUCONATE 0.12 % MT SOLN: 15.0000 mL | Freq: Once | OROMUCOSAL | Status: AC

## 2020-09-12 MED ORDER — PHENYLEPHRINE 40 MCG/ML (10ML) SYRINGE FOR IV PUSH (FOR BLOOD PRESSURE SUPPORT)
PREFILLED_SYRINGE | INTRAVENOUS | Status: DC | PRN
  Administered 2020-09-12: 80 ug via INTRAVENOUS

## 2020-09-12 MED ORDER — FENTANYL CITRATE (PF) 250 MCG/5ML IJ SOLN
INTRAMUSCULAR | Status: AC
  Filled 2020-09-12: qty 5

## 2020-09-12 MED ORDER — SCOPOLAMINE 1 MG/3DAYS TD PT72
MEDICATED_PATCH | TRANSDERMAL | Status: AC
  Filled 2020-09-12: qty 1

## 2020-09-12 MED ORDER — LIDOCAINE 2% (20 MG/ML) 5 ML SYRINGE
INTRAMUSCULAR | Status: AC
  Filled 2020-09-12: qty 5

## 2020-09-12 MED ORDER — FENTANYL CITRATE (PF) 250 MCG/5ML IJ SOLN
INTRAMUSCULAR | Status: DC | PRN
  Administered 2020-09-12 (×2): 25 ug via INTRAVENOUS

## 2020-09-12 MED ORDER — PHENYLEPHRINE 40 MCG/ML (10ML) SYRINGE FOR IV PUSH (FOR BLOOD PRESSURE SUPPORT)
PREFILLED_SYRINGE | INTRAVENOUS | Status: AC
  Filled 2020-09-12: qty 30

## 2020-09-12 MED ORDER — KETOROLAC TROMETHAMINE 30 MG/ML IJ SOLN
INTRAMUSCULAR | Status: AC
  Filled 2020-09-12: qty 1

## 2020-09-12 MED ORDER — POVIDONE-IODINE 10 % EX SWAB
2.0000 "application " | Freq: Once | CUTANEOUS | Status: AC
  Administered 2020-09-12: 2 via TOPICAL

## 2020-09-12 MED ORDER — MIDAZOLAM HCL 2 MG/2ML IJ SOLN
INTRAMUSCULAR | Status: DC | PRN
  Administered 2020-09-12: 2 mg via INTRAVENOUS

## 2020-09-12 MED ORDER — DEXAMETHASONE SODIUM PHOSPHATE 10 MG/ML IJ SOLN
INTRAMUSCULAR | Status: DC | PRN
  Administered 2020-09-12: 10 mg via INTRAVENOUS

## 2020-09-12 MED ORDER — PROPOFOL 10 MG/ML IV BOLUS
INTRAVENOUS | Status: AC
  Filled 2020-09-12: qty 20

## 2020-09-12 MED ORDER — ONDANSETRON HCL 4 MG/2ML IJ SOLN
INTRAMUSCULAR | Status: DC | PRN
  Administered 2020-09-12: 4 mg via INTRAVENOUS

## 2020-09-12 MED ORDER — ONDANSETRON HCL 4 MG/2ML IJ SOLN
INTRAMUSCULAR | Status: AC
  Filled 2020-09-12: qty 2

## 2020-09-12 SURGICAL SUPPLY — 20 items
CATH ROBINSON RED A/P 16FR (CATHETERS) ×2 IMPLANT
DECANTER SPIKE VIAL GLASS SM (MISCELLANEOUS) ×2 IMPLANT
FILTER UTR ASPR ASSEMBLY (MISCELLANEOUS) ×2 IMPLANT
GLOVE SURG LTX SZ6.5 (GLOVE) ×4 IMPLANT
GLOVE SURG UNDER POLY LF SZ7 (GLOVE) ×4 IMPLANT
GOWN STRL REUS W/ TWL LRG LVL3 (GOWN DISPOSABLE) ×2 IMPLANT
GOWN STRL REUS W/TWL LRG LVL3 (GOWN DISPOSABLE) ×2
HOSE CONNECTING 18IN BERKELEY (TUBING) ×2 IMPLANT
KIT BERKELEY 1ST TRI 3/8 NO TR (MISCELLANEOUS) ×2 IMPLANT
KIT BERKELEY 1ST TRIMESTER 3/8 (MISCELLANEOUS) ×2 IMPLANT
NS IRRIG 1000ML POUR BTL (IV SOLUTION) ×2 IMPLANT
PACK VAGINAL MINOR WOMEN LF (CUSTOM PROCEDURE TRAY) ×2 IMPLANT
PAD OB MATERNITY 4.3X12.25 (PERSONAL CARE ITEMS) ×2 IMPLANT
SET BERKELEY SUCTION TUBING (SUCTIONS) ×2 IMPLANT
TOWEL GREEN STERILE FF (TOWEL DISPOSABLE) ×4 IMPLANT
UNDERPAD 30X36 HEAVY ABSORB (UNDERPADS AND DIAPERS) ×2 IMPLANT
VACURETTE 10 RIGID CVD (CANNULA) IMPLANT
VACURETTE 7MM CVD STRL WRAP (CANNULA) IMPLANT
VACURETTE 8 RIGID CVD (CANNULA) IMPLANT
VACURETTE 9 RIGID CVD (CANNULA) IMPLANT

## 2020-09-12 NOTE — Transfer of Care (Signed)
Immediate Anesthesia Transfer of Care Note  Patient: Meredith Gonzales  Procedure(s) Performed: DILATATION AND EVACUATION (Vagina )  Patient Location: PACU  Anesthesia Type:General  Level of Consciousness: awake, alert , oriented and patient cooperative  Airway & Oxygen Therapy: Patient Spontanous Breathing  Post-op Assessment: Report given to RN, Post -op Vital signs reviewed and stable and Patient moving all extremities X 4  Post vital signs: Reviewed and stable  Last Vitals:  Vitals Value Taken Time  BP 127/74 09/12/20 1452  Temp 36.7 C 09/12/20 1452  Pulse 65 09/12/20 1453  Resp 17 09/12/20 1453  SpO2 99 % 09/12/20 1453  Vitals shown include unvalidated device data.  Last Pain:  Vitals:   09/12/20 1209  PainSc: 10-Worst pain ever      Patients Stated Pain Goal: 2 (09/12/20 1209)  Complications: No notable events documented.

## 2020-09-12 NOTE — Op Note (Signed)
09/12/2020  2:58 PM  PATIENT:  Meredith Gonzales  35 y.o. female  PRE-OPERATIVE DIAGNOSIS:  Missed abortion  POST-OPERATIVE DIAGNOSIS:  Missed abortion  PROCEDURE:  Procedure(s): DILATATION AND EVACUATION  SURGEON:  Jerene Bears  ASSISTANTS: OR staff.    ANESTHESIA:   general  ESTIMATED BLOOD LOSS: 75 mL  BLOOD ADMINISTERED:none   FLUIDS: 500cc LR  UOP: drained with I&O cath prior to procedure  SPECIMEN:  products of conception  DISPOSITION OF SPECIMEN:  PATHOLOGY  FINDINGS: about 8 week size uterus, dark blood with some clot present at cervical os  DESCRIPTION OF OPERATION: Patient was taken to the operating room.  She is placed in the supine position. SCDs were on her lower extremities and functioning properly. General anesthesia with an LMA was administered without difficulty. Dr. Jean Rosenthal, anesthesia, oversaw case.  Legs were then placed in the [facility] stirrups in the low lithotomy position. The legs were lifted to the high lithotomy position and the Betadine prep was used on the inner thighs perineum and vagina x3. Patient was draped in a normal standard fashion. An in and out catheterization with a red rubber Foley catheter was performed. Approximately 50 cc of clear urine was noted. A bivalve speculum was placed the vagina. The anterior lip of the cervix was grasped with single-tooth tenaculum.  A paracervical block of 1% lidocaine mixed one-to-one with epinephrine (1:100,000 units).  10 cc was used total. The cervix is dilated up to #[facility] dilators. The endometrial cavity sounded to 9 cm.  A #7 curved suction tip was obtained.  This was passed to the fundus.  Suction was applied.  This was rotated in a clockwise fashion.  Once green zone was reached, suction was stopped and tip removed.  Blood and white tissue consistent with products of conception was present.  This procedure was performed another two times before using a smooth curette to curette the endometrial  cavity until a rough gritty texture was noted in all quadrants.  Suction tip was then obtained again and passed to the fundus of the uterus.  Suction was again applied and tip rotated in clockwise fashion until into green zone.  Suction stop and tip removed.  This was performed on more time.  There was minimal bleeding at this point.  Procedure was ended.  Tenaculum removed.  Silver nitrate used to obtained hemostasis at the tenaculum sites.  The speculum was removed from the vagina. The prep was cleansed of the patient's skin. The legs are positioned back in the supine position. Sponge, lap, needle, initially counts were correct x2. Patient was taken to recovery in stable condition.  COUNTS:  YES  PLAN OF CARE: Transfer to PACU

## 2020-09-12 NOTE — Discharge Instructions (Signed)
Post-surgical Instructions, Outpatient Surgery  You may expect to feel dizzy, weak, and drowsy for as long as 24 hours after receiving the medicine that made you sleep (anesthetic). For the first 24 hours after your surgery:   Do not drive a car, ride a bicycle, participate in physical activities, or take public transportation until you are done taking narcotic pain medicines or as directed by Dr. Hyacinth Meeker.  Do not drink alcohol or take tranquilizers.  Do not take medicine that has not been prescribed by your physicians.  Do not sign important papers or make important decisions while on narcotic pain medicines.  Have a responsible person with you.   PAIN MANAGEMENT Motrin 800mg .  (This is the same as 4-200mg  over the counter tablets of Motrin or ibuprofen.)  You may take this every eight hours or as needed for cramping.   Vicodin 5/325mg .  For more severe pain, take one tablet every six hours as needed for pain control.  (Remember that narcotic pain medications increase your risk of constipation.  If this becomes a problem, you may take an over the counter stool softener like Colace 100mg  up to four times a day.)  DO'S AND DON'T'S Do not take a tub bath for one week.  You may shower on the first day after your surgery Do not do any heavy lifting for one to two weeks.  This increases the chance of bleeding. Do move around as you feel able.  Stairs are fine.  You may begin to exercise again as you feel able.  Do not lift any weights for two weeks. Do not put anything in the vagina for two weeks--no tampons, intercourse, or douching.    REGULAR MEDIATIONS/VITAMINS: You may restart all of your regular medications as prescribed. You may restart all of your vitamins as you normally take them.    PLEASE CALL OR SEEK MEDICAL CARE IF: You have persistent nausea and vomiting.  You have trouble eating or drinking.  You have an oral temperature above 100.5.  You have constipation that is not helped by  adjusting diet or increasing fluid intake. Pain medicines are a common cause of constipation.  You have heavy vaginal bleeding

## 2020-09-12 NOTE — Anesthesia Postprocedure Evaluation (Signed)
Anesthesia Post Note  Patient: Meredith Gonzales  Procedure(s) Performed: DILATATION AND EVACUATION (Vagina )     Patient location during evaluation: PACU Anesthesia Type: General Level of consciousness: awake and alert, patient cooperative and oriented Pain management: pain level controlled Vital Signs Assessment: post-procedure vital signs reviewed and stable Respiratory status: spontaneous breathing, nonlabored ventilation and respiratory function stable Cardiovascular status: blood pressure returned to baseline and stable Postop Assessment: no apparent nausea or vomiting, adequate PO intake and able to ambulate Anesthetic complications: no   No notable events documented.  Last Vitals:  Vitals:   09/12/20 1522 09/12/20 1528  BP: 135/61 135/61  Pulse: 68 (!) 55  Resp: 16 15  Temp: 36.7 C   SpO2: 100% 100%    Last Pain:  Vitals:   09/12/20 1522  PainSc: 0-No pain                 Scarlette Hogston,E. Nkosi Cortright

## 2020-09-12 NOTE — H&P (Signed)
Meredith Gonzales is an 35 y.o. female G4P2ectopic 1 HF with missed abortion here for suction D&E.  Most recent PUS showed small embryo without change in size and no fecal cardiac activity.  Gestational sac was lower in the uterus as well.  She was counseled about D&C at this point and she has decided to proceed.  I have personally discussed ultrasound findings and procedure as well as risks with her.  Bleeding, infection, risk of retained tissue and need for future procedure, risks of injury to uterus and risk of difficulty with future pregnancies all discussed today.  Ptis desirous of completed of D&E at this time due to persistent pain, bleeding and no change in ultrasound findings.   Pertinent Gynecological History: Blood transfusions: none Sexually transmitted diseases: no past history Previous GYN Procedures:  cesarean section x 2   Last pap: not in EPIC OB History: G4, P2   Menstrual History: No LMP recorded. Patient is pregnant.    Past Medical History:  Diagnosis Date   Breast changes, fibrocystic    Complication of anesthesia    causes bp to go down per patient and has to have med to raise bp   Depression    Ectopic pregnancy 2016   UTI (urinary tract infection)     Past Surgical History:  Procedure Laterality Date   CESAREAN SECTION     2012 and 2014 - c/s x 2   LAPAROSCOPIC ABDOMINAL EXPLORATION  2016   removal of ectopic pregnancy   LAPAROSCOPY Right 03/25/2014   Procedure: LAPAROSCOPY OPERATIVE with removal of right ovarian cyst wall;  Surgeon: Reva Bores, MD;  Location: [facility];  Service: Gynecology;  Laterality: Right;    Family History  Problem Relation Age of Onset   Heart disease Mother        pacemaker   Diabetes Mother    Healthy Father    Cancer Son    Down syndrome Son     Social History:  reports that she has never smoked. She has never used smokeless tobacco. She reports that she does not drink alcohol and does not use drugs.  Allergies:   Allergies  Allergen Reactions   Other     Anesthesia-hypotension     Medications Prior to Admission  Medication Sig Dispense Refill Last Dose   oxyCODONE (ROXICODONE) 5 MG immediate release tablet Take 1 tablet (5 mg total) by mouth every 8 (eight) hours as needed. 10 tablet 0 09/11/2020   sertraline (ZOLOFT) 50 MG tablet Take 50 mg by mouth daily.   Past Week   ibuprofen (ADVIL) 600 MG tablet Take 1 tablet (600 mg total) by mouth every 6 (six) hours as needed. (Patient not taking: No sig reported) 30 tablet 0 Not Taking   Prenatal Vit-Fe Fumarate-FA (PRENATAL VITAMIN PLUS LOW IRON) 27-1 MG TABS Take 1 tablet by mouth daily.   Unknown   promethazine (PHENERGAN) 25 MG tablet Take 1 tablet (25 mg total) by mouth every 6 (six) hours as needed for nausea or vomiting. (Patient not taking: No sig reported) 30 tablet 0 Not Taking    Review of Systems  Constitutional: Negative.   Genitourinary:  Positive for pelvic pain and vaginal bleeding.   Blood pressure (!) 134/91, pulse 73, temperature 98.1 F (36.7 C), resp. rate 18, height 5\' 3"  (1.6 m), weight 69.9 kg, SpO2 99 %. Physical Exam Constitutional:      Appearance: Normal appearance.  Cardiovascular:     Rate and Rhythm: Normal rate  and regular rhythm.     Pulses: Normal pulses.     Heart sounds: Normal heart sounds.  Pulmonary:     Effort: Pulmonary effort is normal.     Breath sounds: Normal breath sounds.  Neurological:     General: No focal deficit present.     Mental Status: She is alert.  Psychiatric:        Mood and Affect: Mood normal.    Results for orders placed or performed during the hospital encounter of 09/12/20 (from the past 24 hour(s))  Type and screen     Status: None   Collection Time: 09/12/20 12:12 PM  Result Value Ref Range   ABO/RH(D) AB POS    Antibody Screen NEG    Sample Expiration      09/15/2020,2359 Performed at [facility], [address] [place], [place] [address]     No results  found.  35 yo G4P2ectopic 1 Hispanic female with missed abortion here for suction D&E for completion of pregnancy.  MBT AB+.  Questions answered.  Pt ready to proceed.  Jerene Bears 09/12/2020, 1:54 PM

## 2020-09-12 NOTE — Anesthesia Procedure Notes (Signed)
Procedure Name: LMA Insertion Date/Time: 09/12/2020 2:08 PM Performed by: Nils Pyle, CRNA Pre-anesthesia Checklist: Patient identified, Emergency Drugs available, Suction available and Patient being monitored Patient Re-evaluated:Patient Re-evaluated prior to induction Oxygen Delivery Method: Circle System Utilized Preoxygenation: Pre-oxygenation with 100% oxygen Induction Type: IV induction LMA: LMA inserted LMA Size: 4.0 Number of attempts: 1 Airway Equipment and Method: Bite block Placement Confirmation: positive ETCO2 Tube secured with: Tape Dental Injury: Teeth and Oropharynx as per pre-operative assessment

## 2020-09-12 NOTE — Anesthesia Preprocedure Evaluation (Signed)
Anesthesia Evaluation  Patient identified by MRN, date of birth, ID band Patient awake    Reviewed: Allergy & Precautions, NPO status , Patient's Chart, lab work & pertinent test results  History of Anesthesia Complications History of anesthetic complications: pt reports low BP with LEA.  Airway Mallampati: II  TM Distance: >3 FB Neck ROM: Full    Dental  (+) Dental Advisory Given   Pulmonary neg pulmonary ROS,    breath sounds clear to auscultation       Cardiovascular negative cardio ROS   Rhythm:Regular Rate:Normal     Neuro/Psych  Headaches, Depression    GI/Hepatic negative GI ROS, Neg liver ROS,   Endo/Other  negative endocrine ROS  Renal/GU negative Renal ROS     Musculoskeletal   Abdominal   Peds  Hematology negative hematology ROS (+)   Anesthesia Other Findings   Reproductive/Obstetrics (+) Pregnancy (missed Ab)                             Anesthesia Physical Anesthesia Plan  ASA: 1  Anesthesia Plan: General   Post-op Pain Management:    Induction: Intravenous  PONV Risk Score and Plan: 3 and Ondansetron, Dexamethasone and Scopolamine patch - Pre-op  Airway Management Planned: LMA  Additional Equipment: None  Intra-op Plan:   Post-operative Plan:   Informed Consent: I have reviewed the patients History and Physical, chart, labs and discussed the procedure including the risks, benefits and alternatives for the proposed anesthesia with the patient or authorized representative who has indicated his/her understanding and acceptance.     Dental advisory given and Interpreter used for interveiw  Plan Discussed with: CRNA and Surgeon  Anesthesia Plan Comments:         Anesthesia Quick Evaluation

## 2020-09-13 ENCOUNTER — Encounter (HOSPITAL_COMMUNITY): Payer: Self-pay | Admitting: Obstetrics & Gynecology

## 2020-09-13 LAB — RPR: RPR Ser Ql: NONREACTIVE

## 2020-09-13 LAB — SURGICAL PATHOLOGY

## 2020-09-20 ENCOUNTER — Ambulatory Visit (INDEPENDENT_AMBULATORY_CARE_PROVIDER_SITE_OTHER): Payer: Medicaid Other | Admitting: Obstetrics and Gynecology

## 2020-09-20 ENCOUNTER — Encounter: Payer: Self-pay | Admitting: Obstetrics and Gynecology

## 2020-09-20 ENCOUNTER — Other Ambulatory Visit: Payer: Self-pay

## 2020-09-20 VITALS — BP 135/86 | HR 90 | Wt 148.7 lb

## 2020-09-20 DIAGNOSIS — Z23 Encounter for immunization: Secondary | ICD-10-CM

## 2020-09-20 DIAGNOSIS — Z09 Encounter for follow-up examination after completed treatment for conditions other than malignant neoplasm: Secondary | ICD-10-CM

## 2020-09-20 DIAGNOSIS — O039 Complete or unspecified spontaneous abortion without complication: Secondary | ICD-10-CM

## 2020-09-20 DIAGNOSIS — Z789 Other specified health status: Secondary | ICD-10-CM

## 2020-09-20 DIAGNOSIS — Z5941 Food insecurity: Secondary | ICD-10-CM

## 2020-09-20 DIAGNOSIS — Z8759 Personal history of other complications of pregnancy, childbirth and the puerperium: Secondary | ICD-10-CM | POA: Insufficient documentation

## 2020-09-20 NOTE — Progress Notes (Signed)
Patient reports no active bleeding, reports brown coffee discharge, headaches, and lower right abdomen pain. States she took 1 oxycodone for this pain. Patient states she would like to get pregnant in the near future.   Wynona Canes, CMA

## 2020-09-20 NOTE — Progress Notes (Addendum)
Obstetrics and Gynecology Visit Return Patient Evaluation  Appointment Date: 09/20/2020  Primary Care Provider: Pcp, No  OBGYN Clinic: [facility] for [facility]  Chief Complaint: regular post op visit  History of Present Illness:  Meredith Gonzales is a 35 y.o. [phone] with above CC. PMHx significant for AMA, h/o c-section x 2, h/o child affected with down's and cancer (has passed away), h/o ectopic.  Pt is s/p 8/23 scheduled d&c for missed AB; she was discharged home from the pacu; final path benign CV.  Still with slight brown d/c and slight HA but otherwise feels fine.  Review of Systems: as noted in the History of Present Illness.  Patient Active Problem List   Diagnosis Date Noted   History of ectopic pregnancy 09/20/2020   Language barrier 09/20/2020   SAB (spontaneous abortion) 09/07/2020   Ovarian torsion    Medications:  Theressa Millard had no medications administered during this visit. Current Outpatient Medications  Medication Sig Dispense Refill   sertraline (ZOLOFT) 50 MG tablet Take 50 mg by mouth daily.     HYDROcodone-acetaminophen (NORCO/VICODIN) 5-325 MG tablet Take 1 tablet by mouth every 6 (six) hours as needed for moderate pain or severe pain. (Patient not taking: Reported on 09/20/2020) 12 tablet 0   ibuprofen (ADVIL) 800 MG tablet Take 1 tablet (800 mg total) by mouth every 8 (eight) hours as needed. (Patient not taking: Reported on 09/20/2020) 30 tablet 0   Prenatal Vit-Fe Fumarate-FA (PRENATAL VITAMIN PLUS LOW IRON) 27-1 MG TABS Take 1 tablet by mouth daily. (Patient not taking: Reported on 09/20/2020)     No current facility-administered medications for this visit.    Allergies: is allergic to other.  Physical Exam:  BP 135/86   Pulse 90   Wt 148 lb 11.2 oz (67.4 kg)   Breastfeeding Unknown   BMI 26.34 kg/m  Body mass index is 26.34 kg/m. General appearance: Well nourished, well developed female in no acute  distress.  Neuro/Psych:  Normal mood and affect.    Assessment: pt doing well  Plan:  1. Post Op She desires to try again. D/w her most common reason is due to ama but nothing can affect that. She does not smoke so I recommend that she continue with taking vitamins, waiting until next period before trying again. She had qmonth periods before so I told her if no period in 73m to take a UPT and call us with the results and to call us in the future with any +UPTs so we can follow her closely given her ectopic and AMA history  Patient has Medicaid and I recommend calling to set up an PCP with her pcp for annual, HA  2. Flu vaccine need - Flu Vaccine QUAD 19mo+IM (Fluarix, Fluzone & Alfiuria Quad PF)  3. Food insecurity - AMBULATORY REFERRAL TO [facility]  4. Postop check  5. History of ectopic pregnancy  6. Language barrier Interpreter used.    RTC: PRN  Cornelia Copa MD Attending Center for [employer] [facility])

## 2020-09-22 DIAGNOSIS — O021 Missed abortion: Secondary | ICD-10-CM

## 2020-09-28 ENCOUNTER — Other Ambulatory Visit: Payer: Self-pay

## 2020-09-28 ENCOUNTER — Ambulatory Visit: Payer: Medicaid Other

## 2020-10-13 ENCOUNTER — Encounter (HOSPITAL_BASED_OUTPATIENT_CLINIC_OR_DEPARTMENT_OTHER): Payer: Self-pay | Admitting: Obstetrics & Gynecology

## 2020-10-13 ENCOUNTER — Ambulatory Visit (INDEPENDENT_AMBULATORY_CARE_PROVIDER_SITE_OTHER): Payer: Medicaid Other | Admitting: Obstetrics & Gynecology

## 2020-10-13 ENCOUNTER — Other Ambulatory Visit: Payer: Self-pay

## 2020-10-13 VITALS — BP 120/65 | HR 65 | Ht 64.0 in | Wt 152.8 lb

## 2020-10-13 DIAGNOSIS — Z09 Encounter for follow-up examination after completed treatment for conditions other than malignant neoplasm: Secondary | ICD-10-CM | POA: Diagnosis not present

## 2020-10-13 DIAGNOSIS — O021 Missed abortion: Secondary | ICD-10-CM

## 2020-10-13 DIAGNOSIS — R3915 Urgency of urination: Secondary | ICD-10-CM

## 2020-10-13 MED ORDER — SULFAMETHOXAZOLE-TRIMETHOPRIM 800-160 MG PO TABS: 1.0000 | ORAL_TABLET | Freq: Two times a day (BID) | ORAL | 0 refills | Status: DC

## 2020-10-13 NOTE — Progress Notes (Signed)
GYNECOLOGY  VISIT  CC:   post op recheck  HPI: 35 y.o. G58P2011 Married White or Caucasian female here for recheck after undergoing D&E on 09/12/2020.  She reports bleeding is  normal .  She has no pain.  Bowel function is Normal.  She is having some urinary urgency.  No dysuria.  Is feeling some pelvic pressure.  Is SA and was this morning.    Pathology reviewed:  Yes .  Questions answered.    Claudia, Engineer, structural, present for all communication  MEDS:   Current Outpatient Medications on File Prior to Visit  Medication Sig Dispense Refill   folic acid (FOLVITE) 1 MG tablet Take 1 mg by mouth daily.     Prenatal Vit-Fe Fumarate-FA (PRENATAL VITAMIN PLUS LOW IRON) 27-1 MG TABS Take 1 tablet by mouth daily.     sertraline (ZOLOFT) 50 MG tablet Take 50 mg by mouth daily.     HYDROcodone-acetaminophen (NORCO/VICODIN) 5-325 MG tablet Take 1 tablet by mouth every 6 (six) hours as needed for moderate pain or severe pain. (Patient not taking: No sig reported) 12 tablet 0   ibuprofen (ADVIL) 800 MG tablet Take 1 tablet (800 mg total) by mouth every 8 (eight) hours as needed. (Patient not taking: No sig reported) 30 tablet 0   No current facility-administered medications on file prior to visit.    SH:  Smoking No    PHYSICAL EXAMINATION:    BP 120/65 (BP Location: Left Arm, Patient Position: Sitting, Cuff Size: Small)   Pulse 65   Ht 5\' 4"  (1.626 m)   Wt 152 lb 12.8 oz (69.3 kg)   LMP 10/08/2020   BMI 26.23 kg/m     General appearance: alert, cooperative and appears stated age CV:  Regular rate and rhythm Lungs:  clear to auscultation, no wheezes, rales or rhonchi, symmetric air entry Abdomen: soft, non-tender except above pubic symphasis; bowel sounds normal; no masses,  no organomegaly Incisions:  C/D/I  Pelvic: External genitalia:  no lesions              Urethra:  normal appearing urethra with no masses, tenderness or lesions              Bartholins and Skenes: normal                  Vagina: normal appearing vagina with normal color and discharge, no lesions              Cervix: no lesions              Bimanual Exam:  Uterus:  normal size, contour, position, consistency, mobility, non-tender              Adnexa: no mass, fullness, tenderness  Chaperone, 10/10/2020, was present for exam.  Assessment/Plan: 1. Urinary urgency - Urine Culture - sulfamethoxazole-trimethoprim (BACTRIM DS) 800-160 MG tablet; Take 1 tablet by mouth 2 (two) times daily.  Dispense: 6 tablet; Refill: 0  2. Missed abortion - has done well post op  3. Postop check

## 2020-10-17 LAB — URINE CULTURE

## 2020-11-06 ENCOUNTER — Other Ambulatory Visit: Payer: Self-pay

## 2020-11-06 ENCOUNTER — Ambulatory Visit: Payer: Medicaid Other

## 2020-12-18 ENCOUNTER — Ambulatory Visit (INDEPENDENT_AMBULATORY_CARE_PROVIDER_SITE_OTHER): Payer: Medicaid Other | Admitting: Obstetrics & Gynecology

## 2020-12-18 ENCOUNTER — Encounter (HOSPITAL_BASED_OUTPATIENT_CLINIC_OR_DEPARTMENT_OTHER): Payer: Self-pay | Admitting: Obstetrics & Gynecology

## 2020-12-18 ENCOUNTER — Other Ambulatory Visit: Payer: Self-pay

## 2020-12-18 VITALS — BP 130/89 | HR 67 | Wt 162.0 lb

## 2020-12-18 DIAGNOSIS — N926 Irregular menstruation, unspecified: Secondary | ICD-10-CM | POA: Diagnosis not present

## 2020-12-18 MED ORDER — SERTRALINE HCL 100 MG PO TABS: 100.0000 mg | ORAL_TABLET | Freq: Every day | ORAL | Status: DC

## 2020-12-18 NOTE — Progress Notes (Signed)
GYNECOLOGY  VISIT  CC:   irregualr cycles  HPI: 35 y.o. G36P2011 Married White or Caucasian female here for discussion of menstrual cycles.  Reports in October, menstrual cycle started 10/17 and then November, cycle started 11/11.  Flow lasts 3 days both times.  She had a miscarriage and subsequent D&C in August and doesn't feel like cycles are regular at this time.  Does desire to have another pregnancy.  Discussed with pt that testing for fertility will need to be out of pocket.  Causes of irregular cycles discussed.  She would like to proceed with some blood work.  Pt has hx of ectopic pregnancy so would not recommend any ovulation induction agents prior to assessing tubal patency.    Pt is Spanish speaking and Alita Chyle, translator was present during entire visit.  GYNECOLOGIC HISTORY: Patient's last menstrual period was 12/01/2020. Contraception: none  Patient Active Problem List   Diagnosis Date Noted   Missed abortion    History of ectopic pregnancy 09/20/2020   Language barrier 09/20/2020   SAB (spontaneous abortion) 09/07/2020    Past Medical History:  Diagnosis Date   Breast changes, fibrocystic    Complication of anesthesia    causes bp to go down per patient and has to have med to raise bp   Depression    Ectopic pregnancy 2016   UTI (urinary tract infection)     Past Surgical History:  Procedure Laterality Date   CESAREAN SECTION     2012 and 2014 - c/s x 2   DILATION AND EVACUATION N/A 09/12/2020   Procedure: DILATATION AND EVACUATION;  Surgeon: Jerene Bears, MD;  Location: [facility] OR;  Service: Gynecology;  Laterality: N/A;   LAPAROSCOPIC ABDOMINAL EXPLORATION  2016   removal of ectopic pregnancy   LAPAROSCOPY Right 03/25/2014   Procedure: LAPAROSCOPY OPERATIVE with removal of right ovarian cyst wall;  Surgeon: Reva Bores, MD;  Location: [facility];  Service: Gynecology;  Laterality: Right;    MEDS:   Current Outpatient Medications on File Prior to Visit   Medication Sig Dispense Refill   folic acid (FOLVITE) 1 MG tablet Take 1 mg by mouth daily.     Prenatal Vit-Fe Fumarate-FA (PRENATAL VITAMIN PLUS LOW IRON) 27-1 MG TABS Take 1 tablet by mouth daily.     sertraline (ZOLOFT) 50 MG tablet Take 50 mg by mouth daily.     sulfamethoxazole-trimethoprim (BACTRIM DS) 800-160 MG tablet Take 1 tablet by mouth 2 (two) times daily. (Patient not taking: Reported on 12/18/2020) 6 tablet 0   No current facility-administered medications on file prior to visit.    ALLERGIES: Other  Family History  Problem Relation Age of Onset   Heart disease Mother        pacemaker   Diabetes Mother    Healthy Father    Cancer Son    Down syndrome Son     SH:  married, non smoker  Review of Systems  All other systems reviewed and are negative.  PHYSICAL EXAMINATION:    BP 130/89 (BP Location: Right Arm, Patient Position: Sitting, Cuff Size: Normal)   Pulse 67   Wt 162 lb (73.5 kg)   LMP 12/01/2020   BMI 27.81 kg/m     Physical Exam Constitutional:      Appearance: Normal appearance.  Skin:    General: Skin is warm and dry.  Neurological:     General: No focal deficit present.     Mental Status: She is alert.  Psychiatric:        Mood and Affect: Mood normal.    Assessment/Plan: 1. Irregular periods/menstrual cycles - TSH; Future - Follicle stimulating hormone; Future - may need referral to REI if desires additional evaluation/treatment - continue PNV

## 2021-01-26 ENCOUNTER — Encounter (HOSPITAL_COMMUNITY): Payer: Self-pay

## 2021-01-26 ENCOUNTER — Emergency Department (HOSPITAL_COMMUNITY)
Admission: EM | Admit: 2021-01-26 | Discharge: 2021-01-26 | Disposition: A | Discharge status: Home / Self Care | Payer: Medicaid Other | Attending: Emergency Medicine | Admitting: Emergency Medicine

## 2021-01-26 ENCOUNTER — Other Ambulatory Visit: Payer: Self-pay

## 2021-01-26 DIAGNOSIS — K047 Periapical abscess without sinus: Secondary | ICD-10-CM | POA: Diagnosis not present

## 2021-01-26 DIAGNOSIS — K0889 Other specified disorders of teeth and supporting structures: Secondary | ICD-10-CM | POA: Diagnosis present

## 2021-01-26 LAB — HCG, QUANTITATIVE, PREGNANCY: hCG, Beta Chain, Quant, S: 83 m[IU]/mL — ABNORMAL HIGH (ref <5)

## 2021-01-26 MED ORDER — PRENATAL COMPLETE 14-0.4 MG PO TABS: 1.0000 | ORAL_TABLET | Freq: Every day | ORAL | 0 refills | Status: DC

## 2021-01-26 MED ORDER — PENICILLIN V POTASSIUM 500 MG PO TABS: 500.0000 mg | ORAL_TABLET | Freq: Four times a day (QID) | ORAL | 0 refills | Status: DC

## 2021-01-26 NOTE — Discharge Instructions (Signed)
Take the prescribed medication as directed. Follow-up with dentist about teeth, Dr. Lucky Cowboy. For now, only take tylenol for pain.  No motrin, aleve, aspirin, etc. Would also recommend that you establish care with OB-GYN for ongoing prenatal care.\ Return to the ED for new or worsening symptoms.

## 2021-01-26 NOTE — ED Triage Notes (Signed)
Pt arrives POV for eval of R sided lower dental pain. Pt reports onset yesterday, much worse today

## 2021-01-26 NOTE — ED Provider Notes (Signed)
[facility] EMERGENCY DEPARTMENT Provider Note   CSN: [id number] Arrival date & time: 01/26/21  2127     History  Chief Complaint  Patient presents with   Dental Pain    Meredith Gonzales is a 36 y.o. female.  The history is provided by the patient and medical records.  Dental Pain  36 year old female presenting to the ED with left lower dental pain.  Started yesterday but worsening today.  Pain worse with chewing on affected side.  She has not had any fever or facial swelling.  No difficulty swallowing.  She has been taking ibuprofen at home without relief.  Not currently established with dentist.  Also wants pregnancy test.  States menstrual cycle is about 1 month late.  Home Medications Prior to Admission medications   Medication Sig Start Date End Date Taking? Authorizing Provider  penicillin v potassium (VEETID) 500 MG tablet Take 1 tablet (500 mg total) by mouth 4 (four) times daily for 10 days. 01/26/21 02/05/21 Yes Garlon Hatchet, PA-C  Prenatal Vit-Fe Fumarate-FA (PRENATAL COMPLETE) 14-0.4 MG TABS Take 1 tablet by mouth daily. 01/26/21  Yes Garlon Hatchet, PA-C  folic acid (FOLVITE) 1 MG tablet Take 1 mg by mouth daily.    [provider]  sertraline (ZOLOFT) 100 MG tablet Take 1 tablet (100 mg total) by mouth daily. 12/18/20   Jerene Bears, MD      Allergies    Other    Review of Systems   Review of Systems  HENT:  Positive for dental problem.   All other systems reviewed and are negative.  Physical Exam Updated Vital Signs BP 139/80    Pulse 84    Temp 98.7 F (37.1 C) (Oral)    Resp 17    SpO2 99%   Physical Exam Vitals and nursing note reviewed.  Constitutional:      Appearance: She is well-developed.  HENT:     Head: Normocephalic and atraumatic.     Mouth/Throat:     Comments: Teeth largely in poor dentition, left lower molars are all broken and decayed, surrounding gingiva overall normal in appearance, handling  secretions appropriately, no trismus, no facial or neck swelling, normal phonation without stridor Eyes:     Conjunctiva/sclera: Conjunctivae normal.     Pupils: Pupils are equal, round, and reactive to light.  Cardiovascular:     Rate and Rhythm: Normal rate and regular rhythm.     Heart sounds: Normal heart sounds.  Pulmonary:     Effort: Pulmonary effort is normal.     Breath sounds: Normal breath sounds.  Abdominal:     General: Bowel sounds are normal.     Palpations: Abdomen is soft.  Musculoskeletal:        General: Normal range of motion.     Cervical back: Normal range of motion.  Skin:    General: Skin is warm and dry.  Neurological:     Mental Status: She is alert and oriented to person, place, and time.    ED Results / Procedures / Treatments   Labs (all labs ordered are listed, but only abnormal results are displayed) Labs Reviewed  HCG, QUANTITATIVE, PREGNANCY - Abnormal; Notable for the following components:      Result Value   hCG, Beta Chain, Quant, S 83 (*)    All other components within normal limits    EKG None  Radiology No results found.  Procedures Procedures    Medications Ordered in  ED Medications - No data to display  ED Course/ Medical Decision Making/ A&P                           Medical Decision Making  36 year old female presenting to the ED with left lower dental pain.  Has several broken and decayed left lower molars.  There is no signs of discrete abscess or fluid collection.  She is handling secretions well, normal phonation without stridor.  No facial or neck swelling.  Not clinically consistent with Ludwig's angina.  She did request a pregnancy test as her cycle was about 1 month late.  Her hCG has come back positive at 83.  Will start on antibiotics for dental infection and refer to dentist for follow-up.  She will need to establish care with OB/GYN for ongoing prenatal care as well, start prenatals.  Informed of these results,  recommend to stick with Tylenol only for pain.  Can return here for any new/acute changes.  Final Clinical Impression(s) / ED Diagnoses Final diagnoses:  Dental infection    Rx / DC Orders ED Discharge Orders          Ordered    penicillin v potassium (VEETID) 500 MG tablet  4 times daily        01/26/21 2337    Prenatal Vit-Fe Fumarate-FA (PRENATAL COMPLETE) 14-0.4 MG TABS  Daily        01/26/21 2337              Garlon Hatchet, PA-C 01/26/21 2341    Margarita Grizzle, MD 01/28/21 2154

## 2021-01-27 ENCOUNTER — Encounter (HOSPITAL_COMMUNITY): Payer: Self-pay | Admitting: Obstetrics and Gynecology

## 2021-01-27 ENCOUNTER — Other Ambulatory Visit: Payer: Self-pay

## 2021-01-27 ENCOUNTER — Inpatient Hospital Stay (HOSPITAL_COMMUNITY)
Admission: AD | Admit: 2021-01-27 | Discharge: 2021-01-27 | Disposition: A | Discharge status: Home / Self Care | Payer: Medicaid Other | Attending: Obstetrics and Gynecology | Admitting: Obstetrics and Gynecology

## 2021-01-27 DIAGNOSIS — Z3A08 8 weeks gestation of pregnancy: Secondary | ICD-10-CM | POA: Insufficient documentation

## 2021-01-27 DIAGNOSIS — O99611 Diseases of the digestive system complicating pregnancy, first trimester: Secondary | ICD-10-CM | POA: Diagnosis not present

## 2021-01-27 DIAGNOSIS — K0889 Other specified disorders of teeth and supporting structures: Secondary | ICD-10-CM | POA: Insufficient documentation

## 2021-01-27 DIAGNOSIS — Z789 Other specified health status: Secondary | ICD-10-CM

## 2021-01-27 MED ORDER — OXYCODONE-ACETAMINOPHEN 5-325 MG PO TABS: 1.0000 | ORAL_TABLET | Freq: Four times a day (QID) | ORAL | 0 refills | Status: DC | PRN

## 2021-01-27 MED ORDER — OXYCODONE-ACETAMINOPHEN 5-325 MG PO TABS
2.0000 | ORAL_TABLET | Freq: Once | ORAL | Status: AC
  Administered 2021-01-27: 2 via ORAL
  Filled 2021-01-27: qty 2

## 2021-01-27 NOTE — MAU Note (Addendum)
Meredith Gonzales is a 36 y.o. at Unknown here in MAU reporting: Tooth pain was not as bad yesterday morning but got worse through the day. Was seen in the [facility] ED yesterday. Early pregnant. Has tried tylenol for the pain, but does not help. Also has tried ice packs but has not had relief.  LMP: 12/01/20 Onset of complaint: 01/26/21 Pain score: 10/10 Vitals:   01/27/21 2141  BP: (!) 147/105  Pulse: 90  Resp: 18  Temp: 97.9 F (36.6 C)  SpO2: 100%     FHT: Lab orders placed from triage:

## 2021-01-27 NOTE — MAU Provider Note (Signed)
Event Date/Time   First Provider Initiated Contact with Patient 01/27/21 2201      S [name] is a 36 y.o. [phone] patient who presents to MAU today with complaint of tooth pain.   She reports her pain started 2 days ago.  She states she went to the ED yesterday and was given antibiotics and ibuprofen.  She states she has been taking the medication, but it is not helping.  She reports she has also been taking tylenol with no relief.   O BP (!) 147/105    Pulse 90    Temp 97.9 F (36.6 C) (Oral)    Resp 18    Wt 76.7 kg    LMP 12/01/2020    SpO2 100%    BMI 29.04 kg/m  Physical Exam Constitutional:      General: She is in acute distress.     Appearance: Normal appearance.  HENT:     Head: Normocephalic and atraumatic.  Eyes:     Conjunctiva/sclera: Conjunctivae normal.  Cardiovascular:     Rate and Rhythm: Normal rate.  Pulmonary:     Effort: Pulmonary effort is normal. No respiratory distress.  Musculoskeletal:     Cervical back: Normal range of motion.  Skin:    General: Skin is warm and dry.  Neurological:     Mental Status: She is alert and oriented to person, place, and time.  Psychiatric:        Mood and Affect: Mood normal.        Behavior: Behavior normal.        Thought Content: Thought content normal.    A Medical screening exam complete Tooth Pain Early Pregnancy Language Barrier  P Given dose of Percocet  Rx for percocet sent to pharmacy on file.  Patient also given note for dentist excuse for dental work. Encouraged to start [facility] No other questions Discharge from MAU in stable condition Warning signs for worsening condition that would warrant emergency follow-up discussed Patient may return to MAU as needed  Interpretations completed with assistance of in-person interpreter.   Gerrit Heck, CNM 01/27/2021 10:01 PM

## 2021-02-01 ENCOUNTER — Inpatient Hospital Stay (HOSPITAL_COMMUNITY): Payer: Medicaid Other

## 2021-02-01 ENCOUNTER — Encounter (HOSPITAL_COMMUNITY): Payer: Self-pay | Admitting: Obstetrics & Gynecology

## 2021-02-01 ENCOUNTER — Inpatient Hospital Stay (HOSPITAL_COMMUNITY)
Admission: AD | Admit: 2021-02-01 | Discharge: 2021-02-01 | Disposition: A | Discharge status: Home / Self Care | Payer: Medicaid Other | Attending: Obstetrics & Gynecology | Admitting: Obstetrics & Gynecology

## 2021-02-01 ENCOUNTER — Other Ambulatory Visit: Payer: Self-pay

## 2021-02-01 DIAGNOSIS — O039 Complete or unspecified spontaneous abortion without complication: Secondary | ICD-10-CM | POA: Diagnosis present

## 2021-02-01 DIAGNOSIS — R109 Unspecified abdominal pain: Secondary | ICD-10-CM | POA: Insufficient documentation

## 2021-02-01 DIAGNOSIS — O209 Hemorrhage in early pregnancy, unspecified: Secondary | ICD-10-CM

## 2021-02-01 DIAGNOSIS — M549 Dorsalgia, unspecified: Secondary | ICD-10-CM | POA: Insufficient documentation

## 2021-02-01 LAB — CBC
HCT: 38.4 % (ref 36.0–46.0)
Hemoglobin: 13.4 g/dL (ref 12.0–15.0)
MCH: 29.9 pg (ref 26.0–34.0)
MCHC: 34.9 g/dL (ref 30.0–36.0)
MCV: 85.7 fL (ref 80.0–100.0)
Platelets: 391 10*3/uL (ref 150–400)
RBC: 4.48 MIL/uL (ref 3.87–5.11)
RDW: 12.1 % (ref 11.5–15.5)
WBC: 11.2 10*3/uL — ABNORMAL HIGH (ref 4.0–10.5)
nRBC: 0 % (ref 0.0–0.2)

## 2021-02-01 LAB — COMPREHENSIVE METABOLIC PANEL
ALT: 34 U/L (ref 0–44)
AST: 24 U/L (ref 15–41)
Albumin: 3.8 g/dL (ref 3.5–5.0)
Alkaline Phosphatase: 57 U/L (ref 38–126)
Anion gap: 11 (ref 5–15)
BUN: 11 mg/dL (ref 6–20)
CO2: 24 mmol/L (ref 22–32)
Calcium: 9.4 mg/dL (ref 8.9–10.3)
Chloride: 104 mmol/L (ref 98–111)
Creatinine, Ser: 0.71 mg/dL (ref 0.44–1.00)
GFR, Estimated: >60 mL/min (ref >60)
Glucose, Bld: 94 mg/dL (ref 70–99)
Potassium: 3.7 mmol/L (ref 3.5–5.1)
Sodium: 139 mmol/L (ref 135–145)
Total Bilirubin: 0.3 mg/dL (ref 0.3–1.2)
Total Protein: 7.3 g/dL (ref 6.5–8.1)

## 2021-02-01 LAB — WET PREP, GENITAL
Clue Cells Wet Prep HPF POC: NONE SEEN
Sperm: NONE SEEN
Trich, Wet Prep: NONE SEEN
WBC, Wet Prep HPF POC: <10 (ref <10)
Yeast Wet Prep HPF POC: NONE SEEN

## 2021-02-01 LAB — HCG, QUANTITATIVE, PREGNANCY: hCG, Beta Chain, Quant, S: 32 m[IU]/mL — ABNORMAL HIGH (ref <5)

## 2021-02-01 NOTE — MAU Provider Note (Addendum)
History     CSN: [id number]  Arrival date and time: 02/01/21 1145   None     Chief Complaint  Patient presents with   Back Pain   Vaginal Bleeding   Meredith Gonzales is a 36 y.o. Female [id number] @[redacted]w[redacted]d  presents w/ abdominal pain & vaginal bleeding. Pt states bleeding began on 01/31/21 that was light but today it is much darker. Pt also complains of lower back pain that she believes to be related. Pt denies N/V. Miscarriage in 08/2020. Patient reports ectopic pregnancy in 2016 but may be misinformed from her previous surgery due to language barrier.  Back Pain Associated symptoms include abdominal pain. Pertinent negatives include no chest pain, dysuria or fever.  Vaginal Bleeding Associated symptoms include abdominal pain and back pain. Pertinent negatives include no dysuria, fever, nausea, urgency or vomiting.   OB History     Gravida  5   Para  2   Term  2   Preterm      AB  2   Living  1      SAB  1   IAB      Ectopic  1   Multiple      Live Births  2        Obstetric Comments  Both had cord around the neck.  2nd child had downs syndrome, past away from Cancer 2020         Past Medical History:  Diagnosis Date   Breast changes, fibrocystic    Complication of anesthesia    causes bp to go down per patient and has to have med to raise bp   Depression    Ectopic pregnancy 2016   UTI (urinary tract infection)     Past Surgical History:  Procedure Laterality Date   CESAREAN SECTION     2012 and 2014 - c/s x 2   DILATION AND EVACUATION N/A 09/12/2020   Procedure: DILATATION AND EVACUATION;  Surgeon: Jerene BearsMiller, Mary S, MD;  [facility];  Service: Gynecology;  Laterality: N/A;   LAPAROSCOPIC ABDOMINAL EXPLORATION  2016   removal of ectopic pregnancy   LAPAROSCOPY Right 03/25/2014   Procedure: LAPAROSCOPY OPERATIVE with removal of right ovarian cyst wall;  Surgeon: Reva Boresanya S Pratt, MD;  Location: [facility] ORS;  Service: Gynecology;  Laterality: Right;     Family History  Problem Relation Age of Onset   Heart disease Mother        pacemaker   Diabetes Mother    Healthy Father    Cancer Son    Down syndrome Son     Social History   Tobacco Use   Smoking status: Never   Smokeless tobacco: Never  Vaping Use   Vaping Use: Never used  Substance Use Topics   Alcohol use: No   Drug use: No    Allergies:  Allergies  Allergen Reactions   Other     Anesthesia-hypotension     Medications Prior to Admission  Medication Sig Dispense Refill Last Dose   penicillin v potassium (VEETID) 500 MG tablet Take 1 tablet (500 mg total) by mouth 4 (four) times daily for 10 days. 40 tablet 0 02/01/2021   Prenatal Vit-Fe Fumarate-FA (PRENATAL COMPLETE) 14-0.4 MG TABS Take 1 tablet by mouth daily. 60 tablet 0 02/01/2021   sertraline (ZOLOFT) 100 MG tablet Take 1 tablet (100 mg total) by mouth daily.   02/01/2021   folic acid (FOLVITE) 1 MG tablet Take 1 mg by mouth daily.  oxyCODONE-acetaminophen (PERCOCET/ROXICET) 5-325 MG tablet Take 1-2 tablets by mouth every 6 (six) hours as needed for severe pain. 10 tablet 0     Review of Systems  Constitutional:  Negative for fatigue and fever.  Respiratory:  Negative for cough and shortness of breath.   Cardiovascular:  Negative for chest pain and leg swelling.  Gastrointestinal:  Positive for abdominal pain. Negative for nausea and vomiting.  Genitourinary:  Positive for vaginal bleeding. Negative for dysuria and urgency.  Musculoskeletal:  Positive for back pain.  Physical Exam   Blood pressure (!) 137/95, pulse 74, temperature 98 F (36.7 C), temperature source Oral, resp. rate 20, height 5' 4.17" (1.63 m), weight 75.8 kg, last menstrual period 12/01/2020, SpO2 100 %, unknown if currently breastfeeding.  Physical Exam Constitutional:      Appearance: Normal appearance.  HENT:     Head: Normocephalic.  Cardiovascular:     Rate and Rhythm: Normal rate and regular rhythm.     Pulses: Normal  pulses.     Heart sounds: Normal heart sounds.  Pulmonary:     Effort: Pulmonary effort is normal.     Breath sounds: Normal breath sounds. No stridor. No wheezing, rhonchi or rales.  Abdominal:     Tenderness: There is no right CVA tenderness, left CVA tenderness, guarding or rebound.  Genitourinary:    General: Normal vulva.     Vagina: Vaginal discharge present.     Comments: No lesions, dark brown mucous blood in vagina, no clots, no tissue Musculoskeletal:     Right lower leg: No edema.     Left lower leg: No edema.  Skin:    General: Skin is warm and dry.  Neurological:     Mental Status: She is alert.   Results for orders placed or performed during the hospital encounter of 02/01/21 (from the past 24 hour(s))  Wet prep, genital     Status: None   Collection Time: 02/01/21  2:18 PM  Result Value Ref Range   Yeast Wet Prep HPF POC NONE SEEN NONE SEEN   Trich, Wet Prep NONE SEEN NONE SEEN   Clue Cells Wet Prep HPF POC NONE SEEN NONE SEEN   WBC, Wet Prep HPF POC <10 <10   Sperm NONE SEEN   CBC     Status: Abnormal   Collection Time: 02/01/21  2:32 PM  Result Value Ref Range   WBC 11.2 (H) 4.0 - 10.5 K/uL   RBC 4.48 3.87 - 5.11 MIL/uL   Hemoglobin 13.4 12.0 - 15.0 g/dL   HCT 43.1 54.0 - 08.6 %   MCV 85.7 80.0 - 100.0 fL   MCH 29.9 26.0 - 34.0 pg   MCHC 34.9 30.0 - 36.0 g/dL   RDW 76.1 95.0 - 93.2 %   Platelets 391 150 - 400 K/uL   nRBC 0.0 0.0 - 0.2 %  Comprehensive metabolic panel     Status: None   Collection Time: 02/01/21  2:32 PM  Result Value Ref Range   Sodium 139 135 - 145 mmol/L   Potassium 3.7 3.5 - 5.1 mmol/L   Chloride 104 98 - 111 mmol/L   CO2 24 22 - 32 mmol/L   Glucose, Bld 94 70 - 99 mg/dL   BUN 11 6 - 20 mg/dL   Creatinine, Ser 6.71 0.44 - 1.00 mg/dL   Calcium 9.4 8.9 - 24.5 mg/dL   Total Protein 7.3 6.5 - 8.1 g/dL   Albumin 3.8 3.5 - 5.0 g/dL  AST 24 15 - 41 U/L   ALT 34 0 - 44 U/L   Alkaline Phosphatase 57 38 - 126 U/L   Total Bilirubin  0.3 0.3 - 1.2 mg/dL   GFR, Estimated >16>60 >10>60 mL/min   Anion gap 11 5 - 15  hCG, quantitative, pregnancy     Status: Abnormal   Collection Time: 02/01/21  2:32 PM  Result Value Ref Range   hCG, Beta Chain, Quant, S 32 (H) <5 mIU/mL    US OB LESS THAN 14 WEEKS WITH OB TRANSVAGINAL  Result Date: 02/01/2021 CLINICAL DATA:  Vaginal bleeding.  Quantitative beta HCG of 32 EXAM: OBSTETRIC <14 WK US AND TRANSVAGINAL OB US TECHNIQUE: Both transabdominal and transvaginal ultrasound examinations were performed for complete evaluation of the gestation as well as the maternal uterus, adnexal regions, and pelvic cul-de-sac. Transvaginal technique was performed to assess early pregnancy. COMPARISON:  09/09/2020 FINDINGS: Intrauterine gestational sac: None Yolk sac:  Not Visualized. Embryo:  Not Visualized. Maternal uterus/adnexae: Anteverted uterus. Unremarkable appearance of the endometrium. No evidence of an intrauterine gestational sac. Right ovary measures 3.2 x 1.9 x 2.4 cm and appears unremarkable. Left ovary measures 3.1 x 1.3 x 2.6 cm and appears unremarkable. No adnexal masses identified. No free fluid within the pelvis. IMPRESSION: No intrauterine pregnancy or findings suspicious for ectopic pregnancy. Findings are consistent with pregnancy of unknown location and may reflect early intrauterine pregnancy not yet visualized sonographically, occult ectopic pregnancy, or failed pregnancy. Recommend trending of beta HCG as well as follow-up ultrasound in 7-10 days based on clinical course. Electronically Signed   By: Duanne GuessNicholas  Plundo D.O.   On: 02/01/2021 15:54     MAU Course  Procedures  MDM Due to vaginal bleeding in early pregnancy will r/o Ectopic Pregnancy CBC, CMP, Blood type & screen, Quant-hCG, U/S Pelvic/Speculum shows moderate blood in vagina. No active bleeding. Pt only complains of pain in L. Lower back. Denies cramping and heavy bleeding at this time. Rh+ Reviewed Op Note from 2016. Pt had  ovarian torsion in 2016. Negative Ectopic. Assessment and Plan   -SAB Reviewed all labs & U/S. Hemodynamically stable. Rh+ Miscarriage confirmed due to dropping Quant-hCG & no visualized IUP  Discharge home with non-stat Beta-hCG 1/16 Bleeding & Infection precautions discussed Discussed Fertility work up & answered questions of Pt. Will f/u in clinic with OB.   Frankey Pootavid Abney, PA-Student 02/01/2021, 1:55 PM   CNM attestation:  I have seen and examined this patient and agree with above documentation in the PA studentss note.   Meredith Gonzales is a 36 y.o. G4P2011 at 1850w0d reporting back pain on the left side and vaginal bleeding that started today. She denies abdominal  pain, chest pain, fever, SOB, NV, constipation, diarrhea, dysuria.  She is concerned because she had a miscarriage last year.  PE: Patient Vitals for the past 24 hrs:  BP Temp Temp src Pulse Resp SpO2 Height Weight  02/01/21 1748 (!) 136/97 -- -- 84 -- -- -- --  02/01/21 1322 (!) 137/95 -- -- 74 -- -- -- --  02/01/21 1306 (!) 138/97 98 F (36.7 C) Oral 85 20 100 % -- --  02/01/21 1301 -- -- -- -- -- -- 5' 4.17" (1.63 m) 75.8 kg   Gen: calm comfortable, NAD Resp: normal effort, no distress Heart: Regular rate Abd: Soft, NT NEFG: cervix is closed, long, thick; dark brown blood with mucous in the vagina, no tissue, no POC visible. No CMT.  ROS, labs, PMH reviewed  Orders Placed This Encounter  Procedures   Wet prep, genital   US OB LESS THAN 14 WEEKS WITH OB TRANSVAGINAL   CBC   Comprehensive metabolic panel   hCG, quantitative, pregnancy   Discharge patient Discharge disposition: 01-Home or Self Care; Discharge patient date: 02/01/2021   Discharge patient Discharge disposition: 01-Home or Self Care; Discharge patient date: 02/01/2021   Discharge patient Discharge disposition: 01-Home or Self Care; Discharge patient date: 02/01/2021   No orders of the defined types were placed in this  encounter.   MDM -Patient had rule out ectopic work-up; drop in bHCG from 82 to 32 confirms miscarriage in process -Patient stable while in MAU, no nausea, vomiting, bleeding, dizziness.  -CBC stable  -patient only complaint of pain is left back pain; she is hungry and desires discharge.  -I have independently reviewed the [place] images, no IUP visualized  Assessment: 1. Miscarriage   2. Vaginal bleeding in pregnancy, first trimester     Plan: 1. Miscarriage   2. Vaginal bleeding in pregnancy, first trimester    -discussed with Dr. Crissie Reese, who advises follow up quant on Monday (non-stat) and provider appt in 2 weeks. Appts scheduled before patient was discharged. Times, address reviewed with patient.  -reviewed bleeding, infection precautions and when to return to MAU -patient had many questions about her reasons for miscarriage; will discuss at follow up appt in two weeks as well as discuss possible CHTN diagnosis  - Discharge home in stable condition.- Return to maternity admissions symptoms worsen  Marylene Land, CNM 02/02/2021 10:05 AM

## 2021-02-01 NOTE — MAU Note (Signed)
Presents with c/o lower back pain and VB.  Reports VB was brown yesterday, but now is bright red, only noted with wiping.  States hasn't taken anything for the back pain.

## 2021-02-02 ENCOUNTER — Encounter (HOSPITAL_COMMUNITY): Payer: Self-pay | Admitting: Obstetrics & Gynecology

## 2021-02-02 LAB — GC/CHLAMYDIA PROBE AMP (~~LOC~~) NOT AT ARMC
Chlamydia: NEGATIVE
Comment: NEGATIVE
Comment: NORMAL
Neisseria Gonorrhea: NEGATIVE

## 2021-02-05 ENCOUNTER — Other Ambulatory Visit: Payer: Medicaid Other

## 2021-02-05 ENCOUNTER — Other Ambulatory Visit: Payer: Self-pay | Admitting: *Deleted

## 2021-02-05 DIAGNOSIS — O039 Complete or unspecified spontaneous abortion without complication: Secondary | ICD-10-CM

## 2021-02-06 LAB — BETA HCG QUANT (REF LAB): hCG Quant: 42 m[IU]/mL

## 2021-02-15 ENCOUNTER — Ambulatory Visit: Payer: Medicaid Other | Admitting: Student

## 2021-03-09 ENCOUNTER — Encounter: Payer: Medicaid Other | Admitting: Obstetrics & Gynecology

## 2022-02-24 IMAGING — US US OB TRANSVAGINAL
1 series · 15 of 28 positions shown · non-contrast
Comparison: Comparison made with examination from September 07, 2020.

CLINICAL DATA: Vaginal bleeding in a 35-year-old female with
positive pregnancy, beta hCG of [DATE], gestational age by last
menstrual period of 10 weeks and 6 days.

EXAM:
TRANSVAGINAL OB ULTRASOUND
TECHNIQUE: Transvaginal ultrasound was performed for complete evaluation of the
gestation as well as the maternal uterus, adnexal regions, and
pelvic cul-de-sac.

[Series 1: us ob transvaginal · 34 acquisitions, 15 frames shown]
[im 1/34]
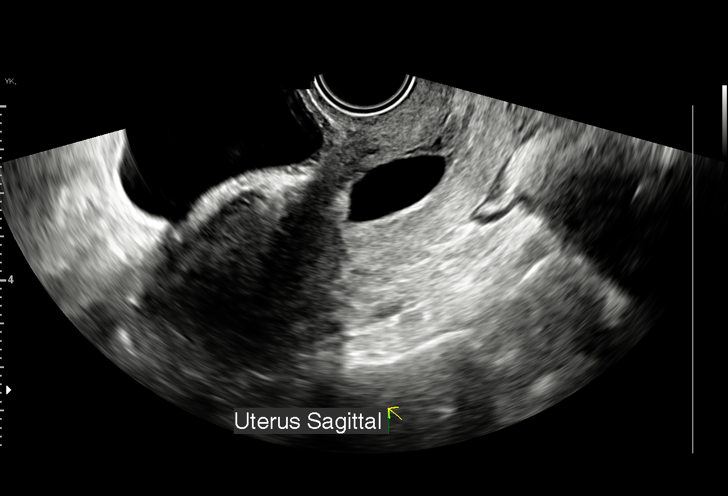
[im 3/34]
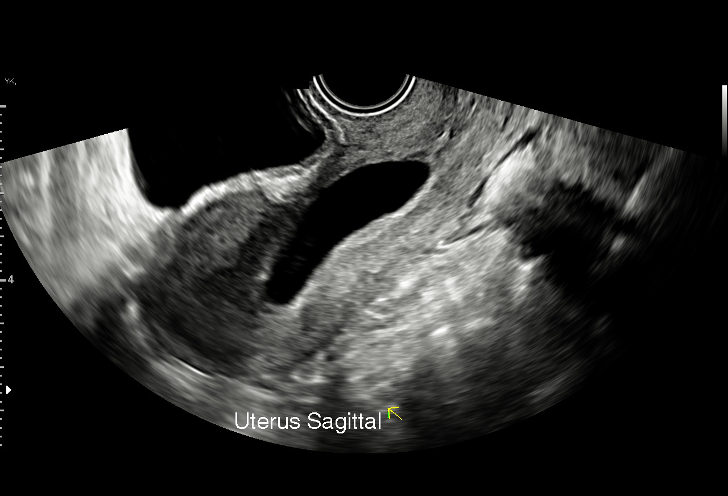
[im 5/34]
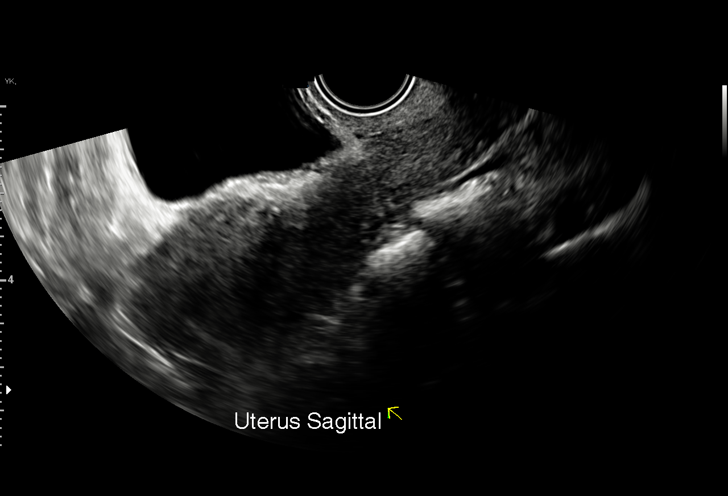
[im 8/34]
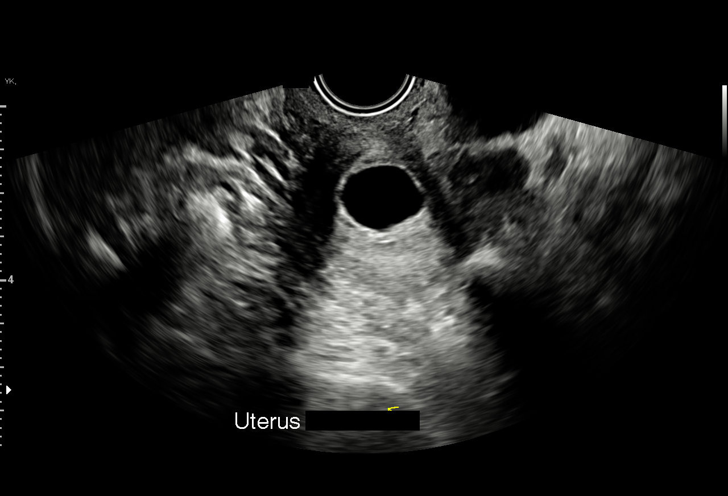
[im 10/34]
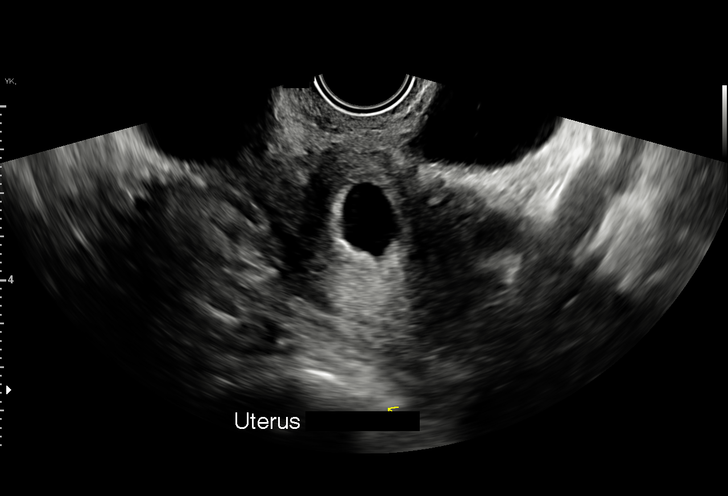
[im 13/34]
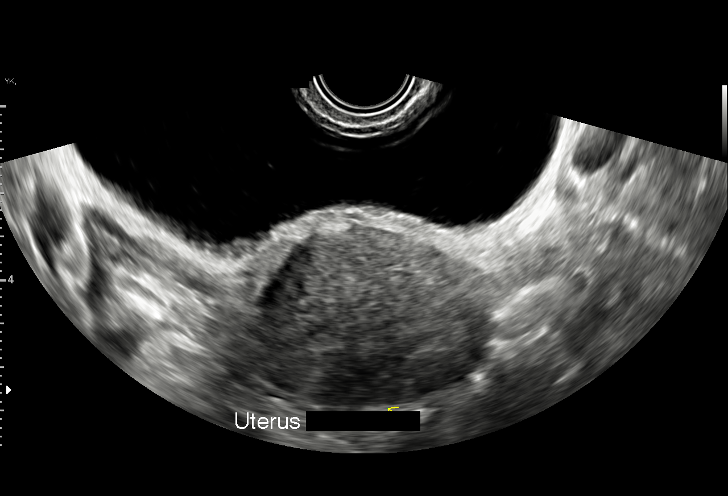
[im 15/34]
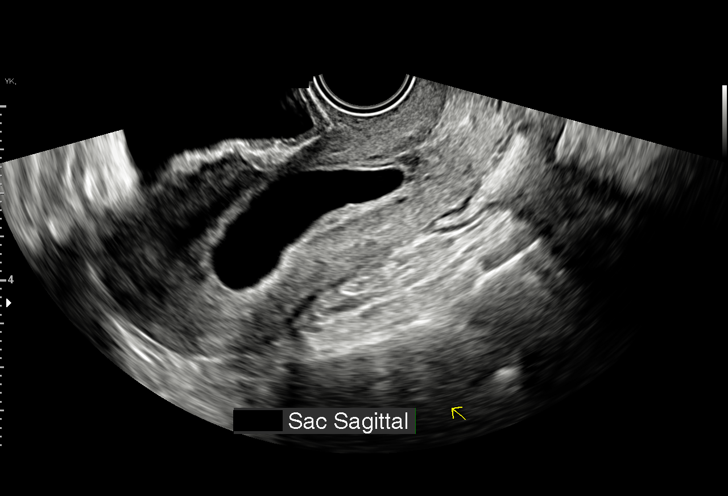
[im 18/34]
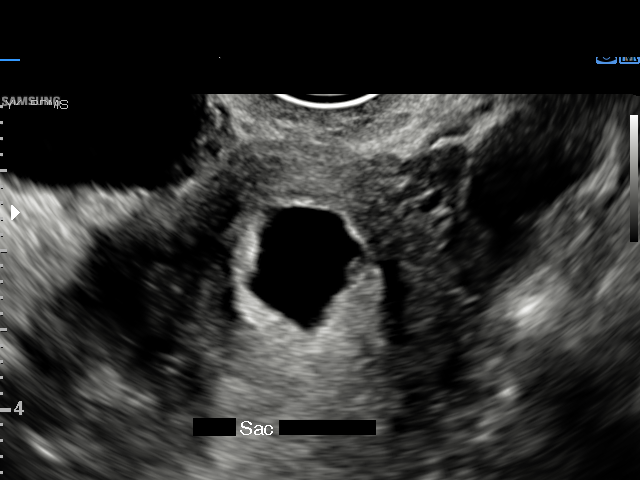
[im 19/34]
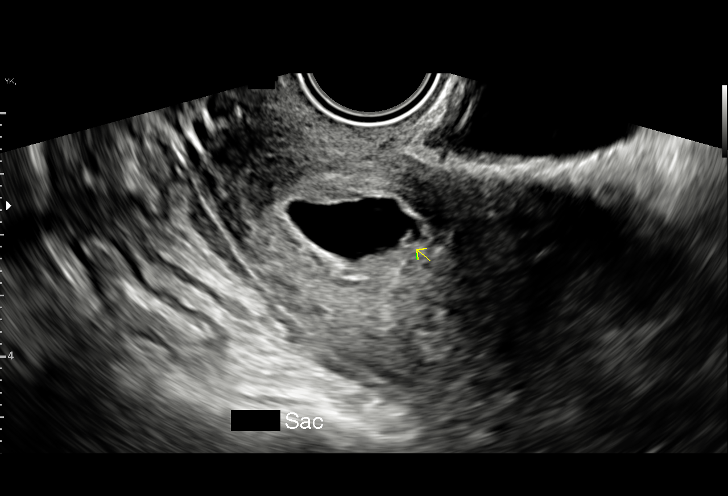
[im 21/34]
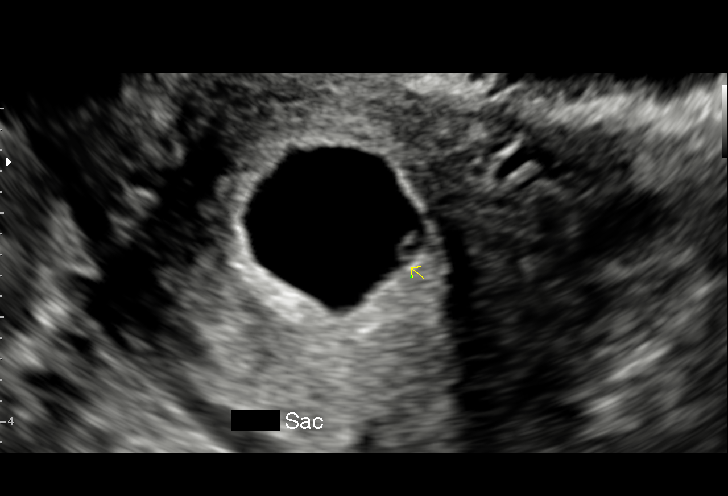
[im 24/34]
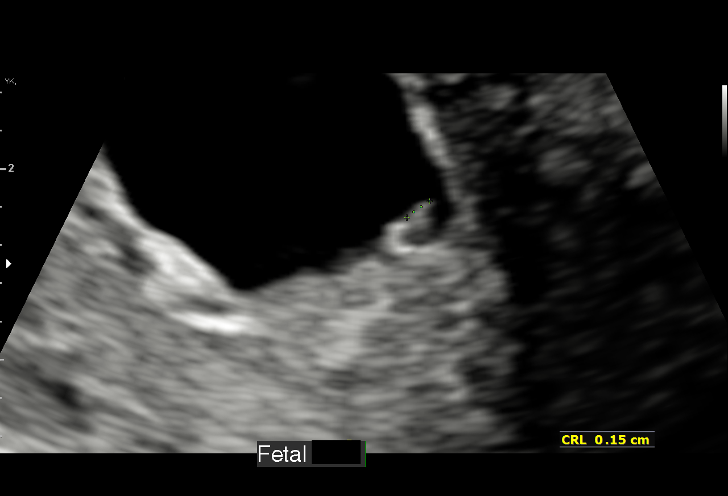
[im 26/34]
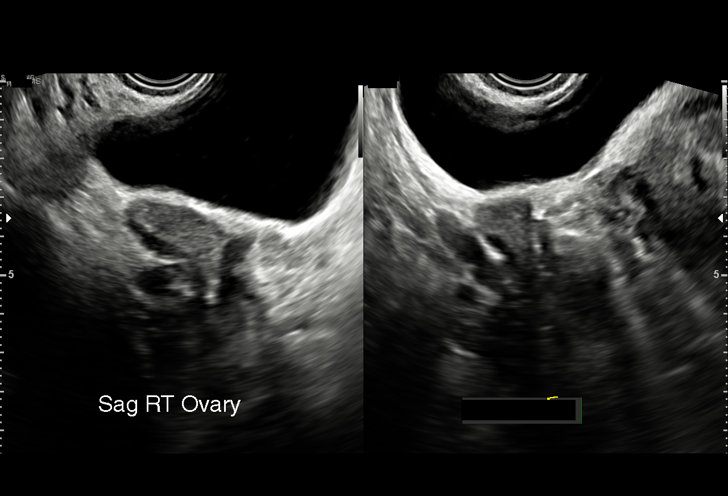
[im 29/34]
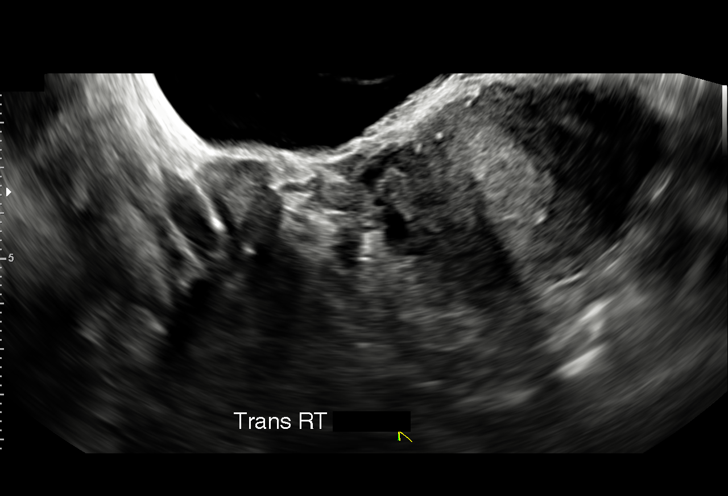
[im 31/34]
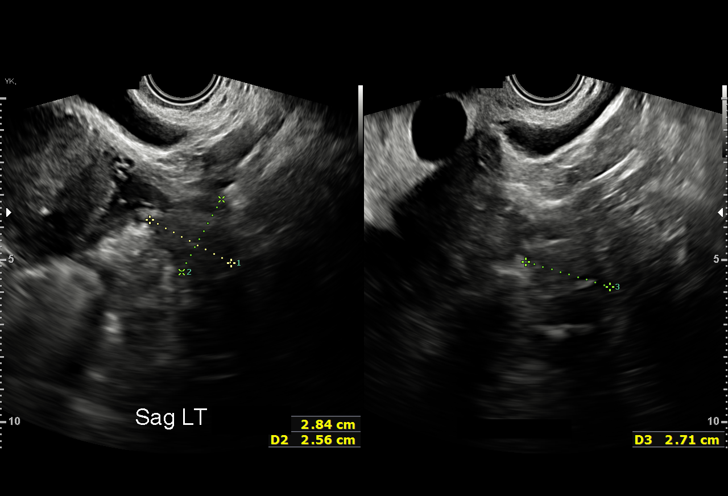
[im 34/34]
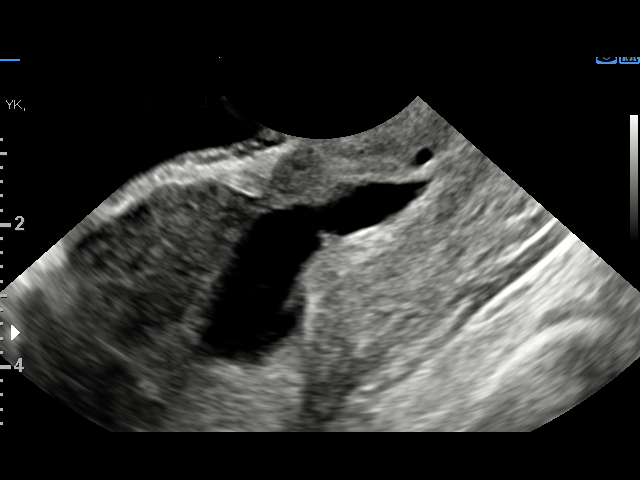

[15 of 28 positions shown; findings below may reference images not displayed]

FINDINGS: Intrauterine gestational sac: Single

Yolk sac: Not definitively visualized, subtle area along the margin
of the gestational sac may represent tiny yolk sac.

Embryo: Not definitively visualized. 1.7 mm area adjacent to
potential yolk sac.

Cardiac Activity: Not Visualized.

MSD: 26.8 mm   7 w   4 d

CRL: 1.7 mm this area is not definitive for small embryo. Estimated
gestational age not possible given small size.

Subchorionic hemorrhage:  None visualized.

Maternal uterus/adnexae: Since the prior study there is been
interval migration of the presumed gestational sac into the lower
uterine segment. It is more oblong than on the previous exam.

No signs of free fluid in the pelvis or visible adnexal mass.

Ovaries with normal appearance.
IMPRESSION: Constellation of findings that suggest abortion in progress of a
failed pregnancy as outlined previously. Confounding factor on the
current study is suggestion of small yolk sac versus debris along
the margin of the gestational sac. No definitive evidence of embryo
is identified. Continued beta HCG correlation and close follow-up
are suggested with ultrasound as warranted.

## 2022-07-19 IMAGING — US US OB < 14 WEEKS - US OB TV
1 series · 15 of 28 positions shown · non-contrast
Comparison: 09/09/2020

CLINICAL DATA: Vaginal bleeding.  Quantitative beta HCG of 32

EXAM:
OBSTETRIC <14 WK US AND TRANSVAGINAL OB US
TECHNIQUE: Both transabdominal and transvaginal ultrasound examinations were
performed for complete evaluation of the gestation as well as the
maternal uterus, adnexal regions, and pelvic cul-de-sac.
Transvaginal technique was performed to assess early pregnancy.

[Series 1: us ob < 14 weeks - us ob tv · 102 acquisitions, 15 frames shown]
[im 1/102]
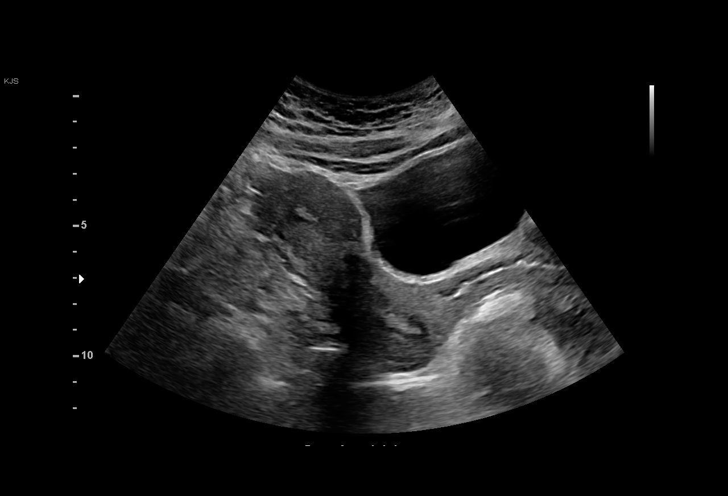
[im 8/102]
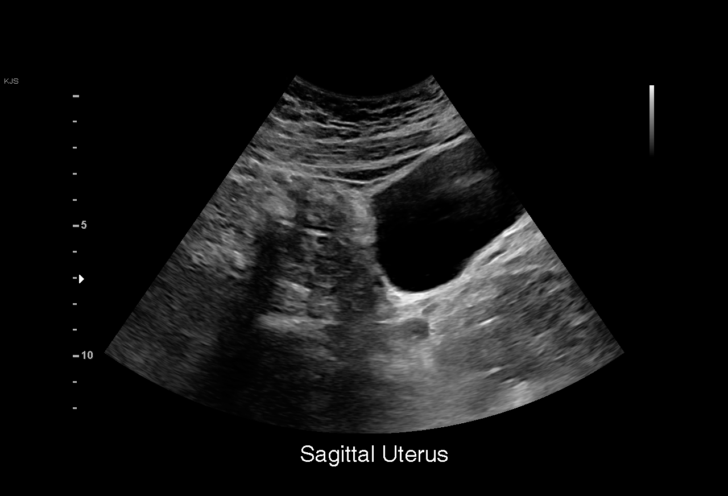
[im 15/102]
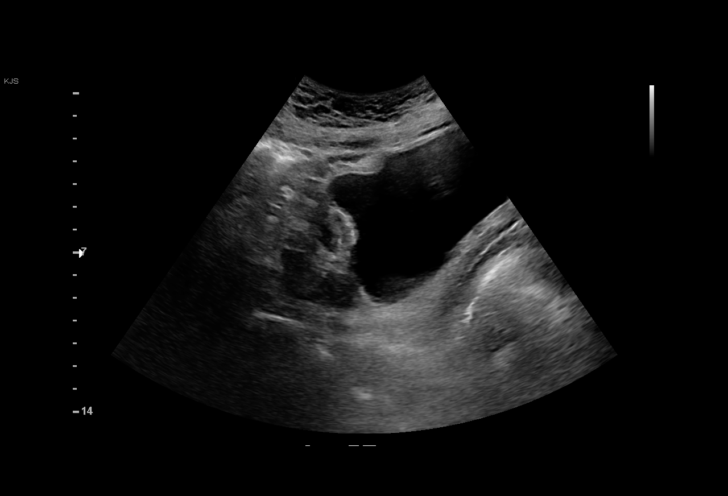
[im 23/102]
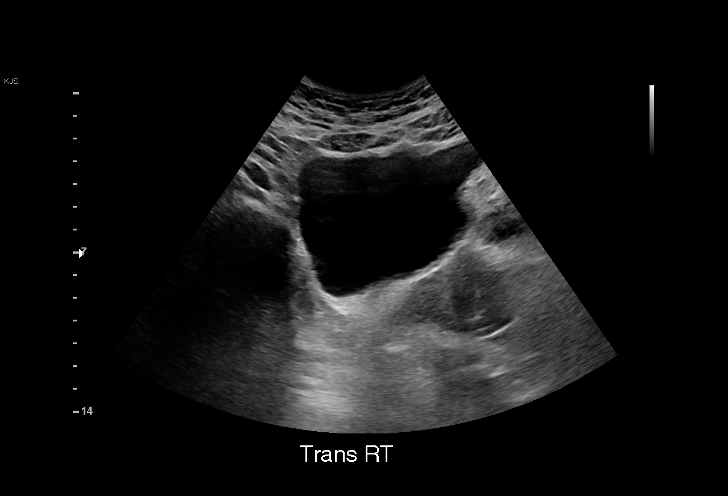
[im 30/102]
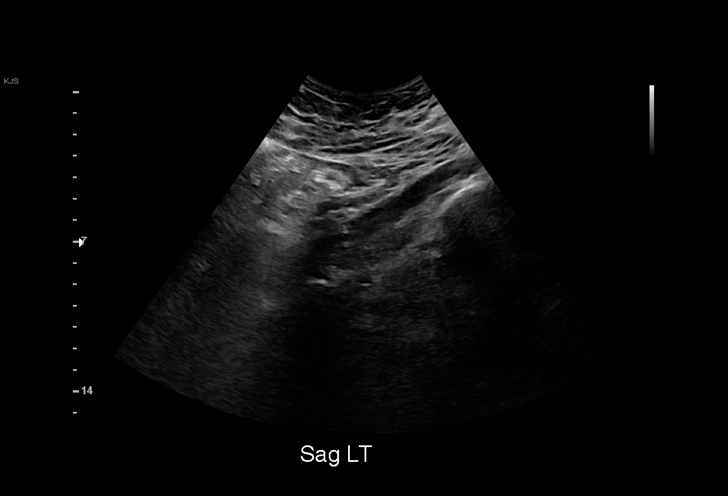
[im 38/102]
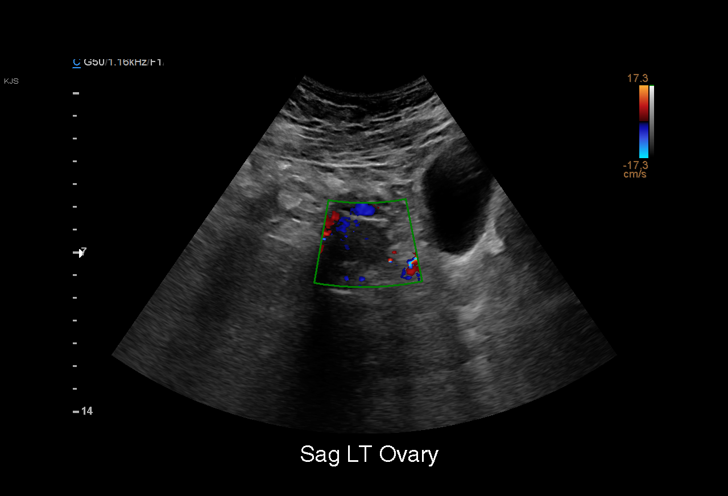
[im 45/102]
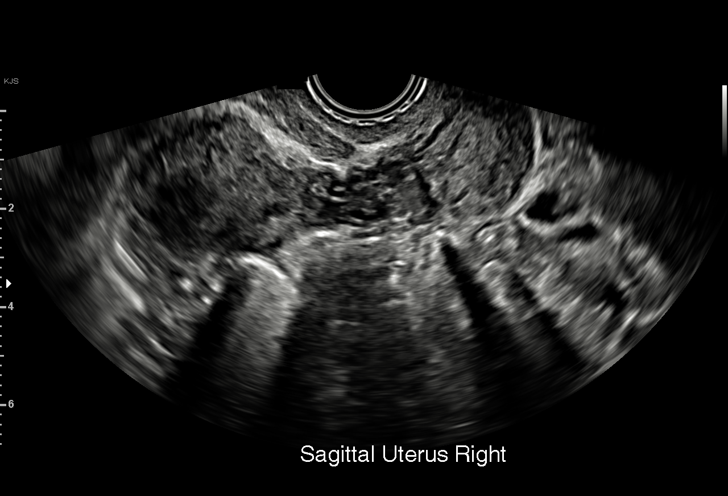
[im 53/102]
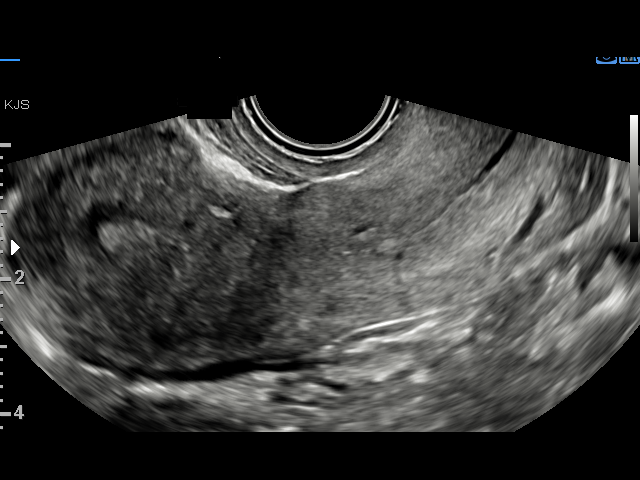
[im 57/102]
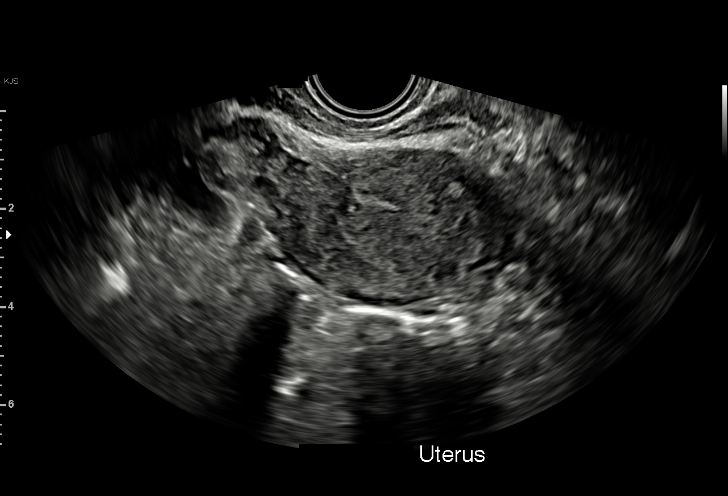
[im 64/102]
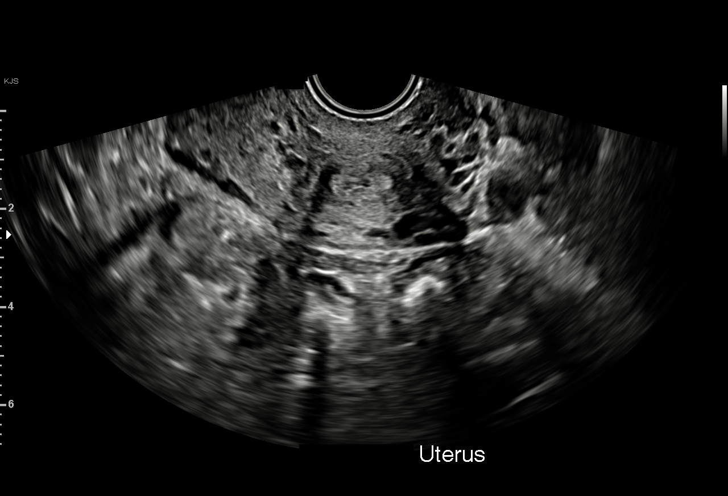
[im 72/102]
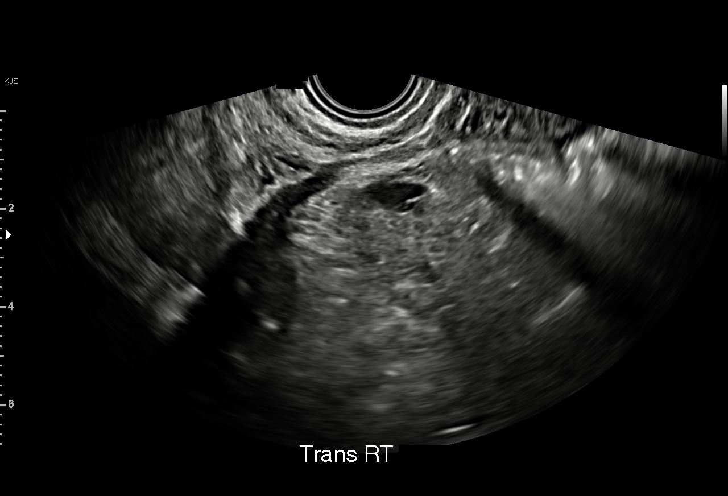
[im 79/102]
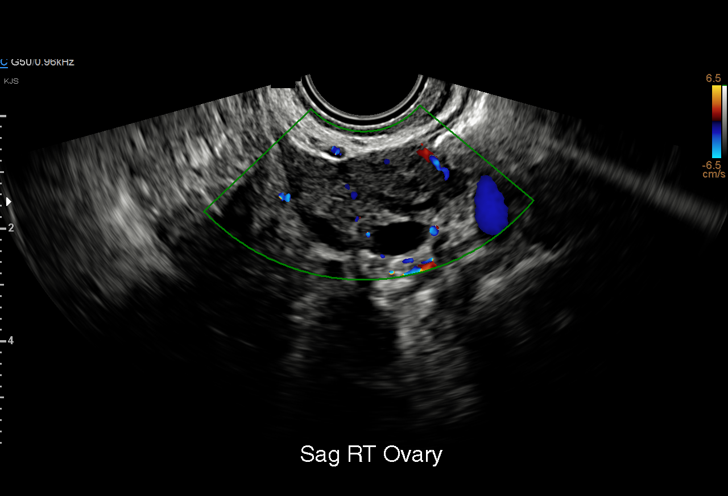
[im 87/102]
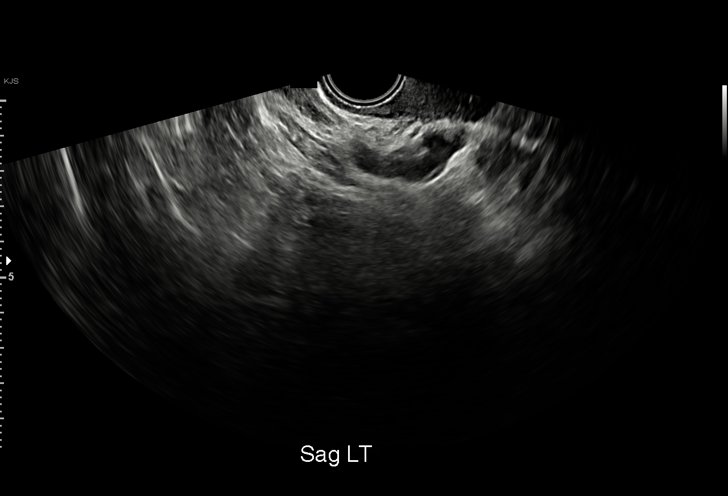
[im 94/102]
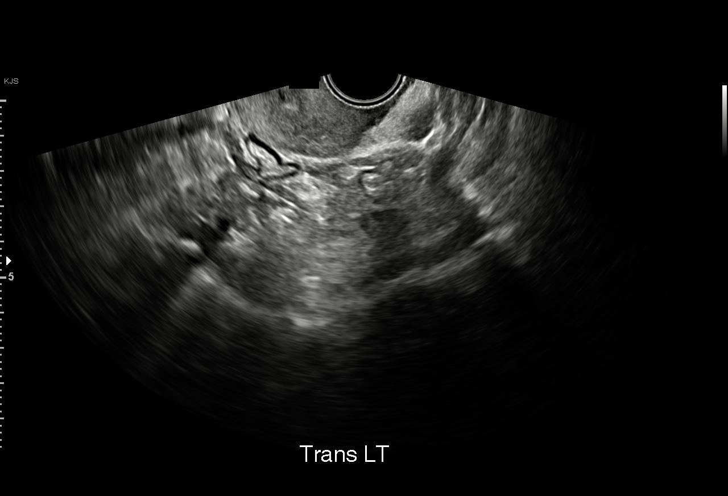
[im 102/102]
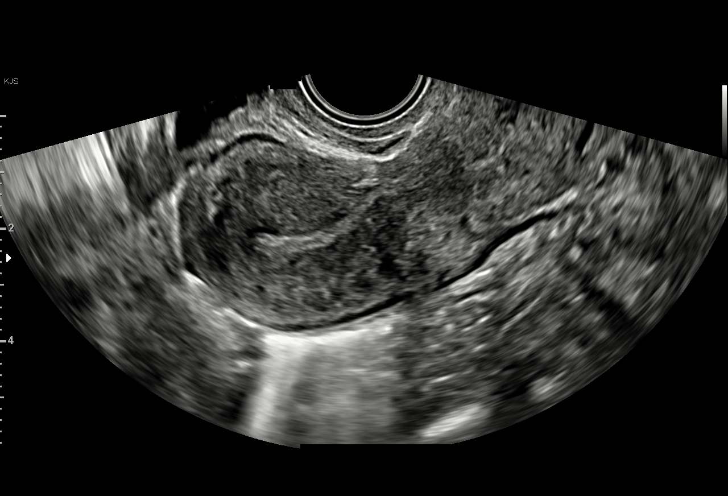

[15 of 28 positions shown; findings below may reference images not displayed]

FINDINGS: Intrauterine gestational sac: None

Yolk sac:  Not Visualized.

Embryo:  Not Visualized.

Maternal uterus/adnexae: Anteverted uterus. Unremarkable appearance
of the endometrium. No evidence of an intrauterine gestational sac.
Right ovary measures 3.2 x 1.9 x 2.4 cm and appears unremarkable.
Left ovary measures 3.1 x 1.3 x 2.6 cm and appears unremarkable. No
adnexal masses identified. No free fluid within the pelvis.
IMPRESSION: No intrauterine pregnancy or findings suspicious for ectopic
pregnancy. Findings are consistent with pregnancy of unknown
location and may reflect early intrauterine pregnancy not yet
visualized sonographically, occult ectopic pregnancy, or failed
pregnancy. Recommend trending of beta HCG as well as follow-up
ultrasound in 7-10 days based on clinical course.

## 2023-01-06 ENCOUNTER — Inpatient Hospital Stay (HOSPITAL_COMMUNITY)
Admission: AD | Admit: 2023-01-06 | Discharge: 2023-01-06 | Disposition: A | Discharge status: Home / Self Care | Payer: BLUE CROSS/BLUE SHIELD | Attending: Obstetrics & Gynecology | Admitting: Obstetrics & Gynecology

## 2023-01-06 ENCOUNTER — Ambulatory Visit: Payer: BLUE CROSS/BLUE SHIELD

## 2023-01-06 ENCOUNTER — Encounter (HOSPITAL_COMMUNITY): Payer: Self-pay | Admitting: Obstetrics & Gynecology

## 2023-01-06 ENCOUNTER — Inpatient Hospital Stay (HOSPITAL_COMMUNITY): Payer: BLUE CROSS/BLUE SHIELD

## 2023-01-06 DIAGNOSIS — O209 Hemorrhage in early pregnancy, unspecified: Secondary | ICD-10-CM | POA: Diagnosis not present

## 2023-01-06 DIAGNOSIS — O3680X0 Pregnancy with inconclusive fetal viability, not applicable or unspecified: Secondary | ICD-10-CM | POA: Diagnosis present

## 2023-01-06 DIAGNOSIS — Z3A01 Less than 8 weeks gestation of pregnancy: Secondary | ICD-10-CM | POA: Insufficient documentation

## 2023-01-06 DIAGNOSIS — O09291 Supervision of pregnancy with other poor reproductive or obstetric history, first trimester: Secondary | ICD-10-CM | POA: Insufficient documentation

## 2023-01-06 DIAGNOSIS — Z3201 Encounter for pregnancy test, result positive: Secondary | ICD-10-CM

## 2023-01-06 LAB — COMPREHENSIVE METABOLIC PANEL
ALT: 20 U/L (ref 0–44)
AST: 21 U/L (ref 15–41)
Albumin: 3.5 g/dL (ref 3.5–5.0)
Alkaline Phosphatase: 55 U/L (ref 38–126)
Anion gap: 8 (ref 5–15)
BUN: 8 mg/dL (ref 6–20)
CO2: 24 mmol/L (ref 22–32)
Calcium: 8.9 mg/dL (ref 8.9–10.3)
Chloride: 104 mmol/L (ref 98–111)
Creatinine, Ser: 0.66 mg/dL (ref 0.44–1.00)
GFR, Estimated: >60 mL/min (ref >60)
Glucose, Bld: 113 mg/dL — ABNORMAL HIGH (ref 70–99)
Potassium: 3.7 mmol/L (ref 3.5–5.1)
Sodium: 136 mmol/L (ref 135–145)
Total Bilirubin: 0.4 mg/dL (ref <1.2)
Total Protein: 6.9 g/dL (ref 6.5–8.1)

## 2023-01-06 LAB — URINALYSIS, ROUTINE W REFLEX MICROSCOPIC
Bilirubin Urine: NEGATIVE
Glucose, UA: NEGATIVE mg/dL
Ketones, ur: NEGATIVE mg/dL
Leukocytes,Ua: NEGATIVE
Nitrite: NEGATIVE
Protein, ur: NEGATIVE mg/dL
Specific Gravity, Urine: 1.021 (ref 1.005–1.030)
pH: 7 (ref 5.0–8.0)

## 2023-01-06 LAB — CBC
HCT: 36.7 % (ref 36.0–46.0)
Hemoglobin: 12.3 g/dL (ref 12.0–15.0)
MCH: 28.5 pg (ref 26.0–34.0)
MCHC: 33.5 g/dL (ref 30.0–36.0)
MCV: 85.2 fL (ref 80.0–100.0)
Platelets: 374 10*3/uL (ref 150–400)
RBC: 4.31 MIL/uL (ref 3.87–5.11)
RDW: 13.2 % (ref 11.5–15.5)
WBC: 12.3 10*3/uL — ABNORMAL HIGH (ref 4.0–10.5)
nRBC: 0 % (ref 0.0–0.2)

## 2023-01-06 LAB — POCT PREGNANCY, URINE: Preg Test, Ur: POSITIVE — AB

## 2023-01-06 LAB — HCG, QUANTITATIVE, PREGNANCY: hCG, Beta Chain, Quant, S: 7270 m[IU]/mL — ABNORMAL HIGH (ref <5)

## 2023-01-06 LAB — ABO/RH: ABO/RH(D): AB POS

## 2023-01-06 NOTE — MAU Note (Signed)
.  Meredith Gonzales is a 37 y.o. at Unknown here in MAU reporting: For the past three days she has noted brown discharge every time she wipes after urination. She reports her urine is clear to yellow. Denies recent IC. Denies vaginal odors, vaginal itching, and vaginal burning.  LMP: 12/02/2022 Onset of complaint: Three days Pain score: Denies pain.  Vitals:   01/06/23 1158  BP: 120/67  Pulse: 71  Resp: 16  Temp: 98.1 F (36.7 C)  SpO2: 100%     FHT: n/a Lab orders placed from triage:  UA

## 2023-01-06 NOTE — MAU Provider Note (Signed)
History     [id number]  Arrival date and time: 01/06/23 1049    Chief Complaint  Patient presents with   Vaginal Discharge   Vaginal Bleeding     HPI Meredith Gonzales is a 37 y.o. at [redacted]w[redacted]d by sure LMP with PMHx notable for L ovarian torsion managed surgically with cystectomy, SAB with D&C in 2022, who presents for vaginal bleeding in setting of positive pregnancy test.   Patient reports she missed her period and took a UPT five days ago which was positive It is also positive here Reports no abdominal pain but for the past three days when using the bathroom has scant amount of dark old blood No burning or pain with urination No vaginal discharge, odor, or itching LMP is sure and was regular prior to conceiving Reports history of ectopic pregnancy though this is not consistent with prior documented surgeries at [facility]   --/--/AB POS Performed at [facility], [address], [place], [place] [address]  [phone])  OB History     Gravida  6   Para  2   Term  2   Preterm      AB  3   Living  1      SAB  2   IAB      Ectopic  1   Multiple      Live Births  2        Obstetric Comments  Both had cord around the neck.  2nd child had downs syndrome, past away from Cancer 2020         Past Medical History:  Diagnosis Date   Breast changes, fibrocystic    Complication of anesthesia    causes bp to go down per patient and has to have med to raise bp   Depression    Ectopic pregnancy 2016   UTI (urinary tract infection)     Past Surgical History:  Procedure Laterality Date   CESAREAN SECTION     2012 and 2014 - c/s x 2   DILATION AND EVACUATION N/A 09/12/2020   Procedure: DILATATION AND EVACUATION;  Surgeon: Jerene Bears, MD;  Location: [facility] OR;  Service: Gynecology;  Laterality: N/A;   LAPAROSCOPIC ABDOMINAL EXPLORATION  2016   removal of ectopic pregnancy   LAPAROSCOPY Right 03/25/2014   Procedure: LAPAROSCOPY OPERATIVE with removal  of right ovarian cyst wall;  Surgeon: Reva Bores, MD;  Location: [facility] ORS;  Service: Gynecology;  Laterality: Right;    Family History  Problem Relation Age of Onset   Heart disease Mother        pacemaker   Diabetes Mother    Healthy Father    Cancer Son    Down syndrome Son     Social History   Socioeconomic History   Marital status: Married    Spouse name: Not on file   Number of children: Not on file   Years of education: Not on file   Highest education level: Not on file  Occupational History   Not on file  Tobacco Use   Smoking status: Never   Smokeless tobacco: Never  Vaping Use   Vaping status: Never Used  Substance and Sexual Activity   Alcohol use: No   Drug use: No   Sexual activity: Yes    Birth control/protection: None    Comment: 14 wsks gestation  Other Topics Concern   Not on file  Social History Narrative   Not on  file   Social Drivers of Health   Financial Resource Strain: Not on file  Food Insecurity: Not on file  Transportation Needs: Not on file  Physical Activity: Not on file  Stress: Not on file  Social Connections: Unknown (06/05/2021)   Received from [facility]   Social Network    Social Network: Not on file  Intimate Partner Violence: Unknown (04/27/2021)   Received from [facility]   HITS    Physically Hurt: Not on file    Insult or Talk Down To: Not on file    Threaten Physical Harm: Not on file    Scream or Curse: Not on file    Allergies  Allergen Reactions   Other     Anesthesia-hypotension     No current facility-administered medications on file prior to encounter.   Current Outpatient Medications on File Prior to Encounter  Medication Sig Dispense Refill   folic acid (FOLVITE) 1 MG tablet Take 1 mg by mouth daily.     Prenatal Vit-Fe Fumarate-FA (PRENATAL COMPLETE) 14-0.4 MG TABS Take 1 tablet by mouth daily. 60 tablet 0   sertraline (ZOLOFT) 100 MG tablet Take 1 tablet (100 mg total) by mouth daily.        ROS Pertinent positives and negative per HPI, all others reviewed and negative  Physical Exam   BP 120/67 (BP Location: Right Arm)   Pulse 71   Temp 98.1 F (36.7 C) (Oral)   Resp 16   Ht 5' 4.17" (1.63 m)   Wt 86.2 kg   LMP 12/02/2022 (Exact Date)   SpO2 100%   BMI 32.44 kg/m   Patient Vitals for the past 24 hrs:  BP Temp Temp src Pulse Resp SpO2 Height Weight  01/06/23 1158 120/67 98.1 F (36.7 C) Oral 71 16 100 % 5' 4.17" (1.63 m) 86.2 kg    Physical Exam Vitals reviewed.  Constitutional:      General: She is not in acute distress.    Appearance: She is well-developed. She is not diaphoretic.  Eyes:     General: No scleral icterus. Pulmonary:     Effort: Pulmonary effort is normal. No respiratory distress.  Abdominal:     General: There is no distension.     Palpations: Abdomen is soft.     Tenderness: There is no abdominal tenderness. There is no guarding or rebound.  Skin:    General: Skin is warm and dry.  Neurological:     Mental Status: She is alert.     Coordination: Coordination normal.      Cervical Exam    Bedside Ultrasound Pt informed that the ultrasound is considered a limited OB ultrasound and is not intended to be a complete ultrasound exam.  Patient also informed that the ultrasound is not being completed with the intent of assessing for fetal or placental anomalies or any pelvic abnormalities.  Explained that the purpose of today's ultrasound is to assess for  viability.  Patient acknowledges the purpose of the exam and the limitations of the study.      My interpretation: First trimester findings: Intrauterine gestational sac seen: yes. Poor visualization without any additional structures appreciated.    Labs Results for orders placed or performed during the hospital encounter of 01/06/23 (from the past 24 hours)  Urinalysis, Routine w reflex microscopic -Urine, Clean Catch     Status: Abnormal   Collection Time: 01/06/23 12:56 PM   Result Value Ref Range   Color, Urine YELLOW YELLOW  APPearance HAZY (A) CLEAR   Specific Gravity, Urine 1.021 1.005 - 1.030   pH 7.0 5.0 - 8.0   Glucose, UA NEGATIVE NEGATIVE mg/dL   Hgb urine dipstick SMALL (A) NEGATIVE   Bilirubin Urine NEGATIVE NEGATIVE   Ketones, ur NEGATIVE NEGATIVE mg/dL   Protein, ur NEGATIVE NEGATIVE mg/dL   Nitrite NEGATIVE NEGATIVE   Leukocytes,Ua NEGATIVE NEGATIVE   RBC / HPF 0-5 0 - 5 RBC/hpf   WBC, UA 0-5 0 - 5 WBC/hpf   Bacteria, UA RARE (A) NONE SEEN   Squamous Epithelial / HPF 11-20 0 - 5 /HPF   Mucus PRESENT    Hyaline Casts, UA PRESENT   ABO/Rh     Status: None   Collection Time: 01/06/23  2:10 PM  Result Value Ref Range   ABO/RH(D)      AB POS Performed at [facility], [address], [place], [place] [address]   CBC     Status: Abnormal   Collection Time: 01/06/23  2:12 PM  Result Value Ref Range   WBC 12.3 (H) 4.0 - 10.5 K/uL   RBC 4.31 3.87 - 5.11 MIL/uL   Hemoglobin 12.3 12.0 - 15.0 g/dL   HCT 60.4 54.0 - 98.1 %   MCV 85.2 80.0 - 100.0 fL   MCH 28.5 26.0 - 34.0 pg   MCHC 33.5 30.0 - 36.0 g/dL   RDW 19.1 47.8 - 29.5 %   Platelets 374 150 - 400 K/uL   nRBC 0.0 0.0 - 0.2 %  Comprehensive metabolic panel     Status: Abnormal   Collection Time: 01/06/23  2:12 PM  Result Value Ref Range   Sodium 136 135 - 145 mmol/L   Potassium 3.7 3.5 - 5.1 mmol/L   Chloride 104 98 - 111 mmol/L   CO2 24 22 - 32 mmol/L   Glucose, Bld 113 (H) 70 - 99 mg/dL   BUN 8 6 - 20 mg/dL   Creatinine, Ser 6.21 0.44 - 1.00 mg/dL   Calcium 8.9 8.9 - 30.8 mg/dL   Total Protein 6.9 6.5 - 8.1 g/dL   Albumin 3.5 3.5 - 5.0 g/dL   AST 21 15 - 41 U/L   ALT 20 0 - 44 U/L   Alkaline Phosphatase 55 38 - 126 U/L   Total Bilirubin 0.4 <1.2 mg/dL   GFR, Estimated >65 >78 mL/min   Anion gap 8 5 - 15  hCG, quantitative, pregnancy     Status: Abnormal   Collection Time: 01/06/23  2:12 PM  Result Value Ref Range   hCG, Beta Chain, Quant, S 7,270 (H) <5 mIU/mL     Imaging No results found.  MAU Course  Procedures Lab Orders         Urinalysis, Routine w reflex microscopic -Urine, Clean Catch         CBC         Comprehensive metabolic panel         hCG, quantitative, pregnancy    No orders of the defined types were placed in this encounter.  Imaging Orders         US OB LESS THAN 14 WEEKS WITH OB TRANSVAGINAL     MDM Moderate (Level 3-4)  Assessment and Plan  #Vaginal bleeding in pregnancy, first trimester #[redacted] weeks gestation of pregnancy Reviewed images with Dr. Despina Hidden, US shows IUGS and corpus luteum. Unusual shape and highly likely to be SAB. We discussed need to trend HCG to help determine the outcome  of this pregnancy. Scheduled for repeat HCG at [facility] in two days. Emphasized that ectopic pregnancy is a life threatening condition if not diagnosed and treated in a timely manner. Reviewed MAU return precautions of severe and progressively worsening abdominal pain, heavy vaginal bleeding soaking >1 pad/hour, or fever.   Dispo: discharged to home in stable condition   Venora Maples, MD/MPH 01/06/23 4:40 PM  Allergies as of 01/06/2023       Reactions   Other    Anesthesia-hypotension         Medication List     TAKE these medications    folic acid 1 MG tablet Commonly known as: FOLVITE Take 1 mg by mouth daily.   Prenatal Complete 14-0.4 MG Tabs Take 1 tablet by mouth daily.   sertraline 100 MG tablet Commonly known as: ZOLOFT Take 1 tablet (100 mg total) by mouth daily.

## 2023-01-06 NOTE — Progress Notes (Unsigned)
Possible Pregnancy  Here today for pregnancy confirmation; patient left urine sample in office to be tested and called with results. UPT in office today is positive. Called patient at number listed in chart using [employer] #[id number]--called patient x2 but unable to leave message d/t patient not having VM set up. Will try again in the afternoon.   Pt reports first positive home UPT on unsure. Reviewed dating with patient:   LMP: unsure--unable to connect with patient  EDD: unsure--unable to connect with patient  Unsure of GA--unable to connect with patient   OB history reviewed. Reviewed medications and allergies with patient; list of medications safe to take during pregnancy given.  Recommended pt begin prenatal vitamin and schedule prenatal care.  Meryl Crutch, RN 01/06/2023  11:19 AM  Attempted to call patient again at number given and listed in chart using [address] [place]) [phone]. Sent to VM and unable to leave message d/t patient not having VM set up.   Maureen Ralphs RN on 01/08/23 at [phone]

## 2023-01-08 ENCOUNTER — Other Ambulatory Visit: Payer: Self-pay

## 2023-01-08 ENCOUNTER — Ambulatory Visit (INDEPENDENT_AMBULATORY_CARE_PROVIDER_SITE_OTHER): Payer: BLUE CROSS/BLUE SHIELD | Admitting: *Deleted

## 2023-01-08 VITALS — BP 115/64 | HR 69 | Ht 64.0 in | Wt 189.6 lb

## 2023-01-08 DIAGNOSIS — Z3A01 Less than 8 weeks gestation of pregnancy: Secondary | ICD-10-CM

## 2023-01-08 DIAGNOSIS — O209 Hemorrhage in early pregnancy, unspecified: Secondary | ICD-10-CM

## 2023-01-08 LAB — BETA HCG QUANT (REF LAB): hCG Quant: 7360 m[IU]/mL

## 2023-01-08 NOTE — Progress Notes (Signed)
Pt presents for stat BHCG.  She reports less bleeding than on 12/16 and is still brown. She denies abdominal/pelvic pain. Pt advised that she will be called with test results later today. She was instructed to return to hospital if she develops heavy vaginal bleeding or abdominal/pelvic pain. Pt voiced understanding.   12/18  1452  BHCG result (7360) reviewed by Dr. Macon Large who finds this level is concerning for failed pregnancy. She recommends repeat non-stat BHCG on 12/20. I called pt w/interpreter [name] Royal and informed her of results as well as next steps in care.  Pt voiced understanding and agreed to lab appt on 12/20 @ 1100.

## 2023-01-08 NOTE — Progress Notes (Signed)
HCG in MAU on 01/06/23 was 7270; HCG today in office 01/08/23 is 7360.  No significant difference.  IUGS seen on 01/06/23 ultrasound, with 2 cm subchorionic hemorrhagia. no adnexal masses.  Patient has an intrauterine pregnancy, but with unknown prognosis and concerned about failing pregnancy.  She needs a repeat non-STAT HCG to monitor progression, can get this on Friday 01/10/23.  Please give pain and bleeding precautions.  Jaynie Collins, MD

## 2023-01-10 ENCOUNTER — Other Ambulatory Visit: Payer: BLUE CROSS/BLUE SHIELD

## 2023-01-10 ENCOUNTER — Other Ambulatory Visit: Payer: Self-pay

## 2023-01-10 DIAGNOSIS — O209 Hemorrhage in early pregnancy, unspecified: Secondary | ICD-10-CM

## 2023-01-11 LAB — BETA HCG QUANT (REF LAB): hCG Quant: 13844 m[IU]/mL

## 2023-01-16 ENCOUNTER — Telehealth: Payer: Self-pay

## 2023-01-16 DIAGNOSIS — O3680X0 Pregnancy with inconclusive fetal viability, not applicable or unspecified: Secondary | ICD-10-CM

## 2023-01-16 NOTE — Telephone Encounter (Addendum)
-----   Message from Jaynie Collins sent at 01/13/2023 11:53 AM EST ----- Consistent with continuing pregnancy.  Viability scan recommended later this week or next week.  Notified pt results with Spanish Interpreter [name] R., pt agreed to U/S appt schedule on 01/23/23 at 1015.  Addison Naegeli, RN  01/16/23

## 2023-01-22 ENCOUNTER — Inpatient Hospital Stay (HOSPITAL_COMMUNITY)
Admission: AD | Admit: 2023-01-22 | Discharge: 2023-01-22 | Disposition: A | Discharge status: Home / Self Care | Payer: BLUE CROSS/BLUE SHIELD | Attending: Obstetrics and Gynecology | Admitting: Obstetrics and Gynecology

## 2023-01-22 ENCOUNTER — Inpatient Hospital Stay (HOSPITAL_COMMUNITY): Payer: BLUE CROSS/BLUE SHIELD

## 2023-01-22 ENCOUNTER — Other Ambulatory Visit: Payer: Self-pay

## 2023-01-22 DIAGNOSIS — O209 Hemorrhage in early pregnancy, unspecified: Secondary | ICD-10-CM

## 2023-01-22 DIAGNOSIS — Z758 Other problems related to medical facilities and other health care: Secondary | ICD-10-CM

## 2023-01-22 DIAGNOSIS — Z603 Acculturation difficulty: Secondary | ICD-10-CM | POA: Diagnosis not present

## 2023-01-22 DIAGNOSIS — O0281 Inappropriate change in quantitative human chorionic gonadotropin (hCG) in early pregnancy: Secondary | ICD-10-CM

## 2023-01-22 DIAGNOSIS — O09521 Supervision of elderly multigravida, first trimester: Secondary | ICD-10-CM | POA: Diagnosis not present

## 2023-01-22 DIAGNOSIS — N8311 Corpus luteum cyst of right ovary: Secondary | ICD-10-CM | POA: Insufficient documentation

## 2023-01-22 DIAGNOSIS — O3481 Maternal care for other abnormalities of pelvic organs, first trimester: Secondary | ICD-10-CM | POA: Insufficient documentation

## 2023-01-22 DIAGNOSIS — Z3A01 Less than 8 weeks gestation of pregnancy: Secondary | ICD-10-CM | POA: Diagnosis not present

## 2023-01-22 DIAGNOSIS — O008 Other ectopic pregnancy without intrauterine pregnancy: Secondary | ICD-10-CM

## 2023-01-22 LAB — CBC
HCT: 38.1 % (ref 36.0–46.0)
Hemoglobin: 12.9 g/dL (ref 12.0–15.0)
MCH: 28.9 pg (ref 26.0–34.0)
MCHC: 33.9 g/dL (ref 30.0–36.0)
MCV: 85.4 fL (ref 80.0–100.0)
Platelets: 372 10*3/uL (ref 150–400)
RBC: 4.46 MIL/uL (ref 3.87–5.11)
RDW: 12.8 % (ref 11.5–15.5)
WBC: 13.6 10*3/uL — ABNORMAL HIGH (ref 4.0–10.5)
nRBC: 0 % (ref 0.0–0.2)

## 2023-01-22 LAB — COMPREHENSIVE METABOLIC PANEL
ALT: 24 U/L (ref 0–44)
AST: 20 U/L (ref 15–41)
Albumin: 3.5 g/dL (ref 3.5–5.0)
Alkaline Phosphatase: 62 U/L (ref 38–126)
Anion gap: 10 (ref 5–15)
BUN: 11 mg/dL (ref 6–20)
CO2: 22 mmol/L (ref 22–32)
Calcium: 8.9 mg/dL (ref 8.9–10.3)
Chloride: 103 mmol/L (ref 98–111)
Creatinine, Ser: 0.77 mg/dL (ref 0.44–1.00)
GFR, Estimated: >60 mL/min (ref >60)
Glucose, Bld: 100 mg/dL — ABNORMAL HIGH (ref 70–99)
Potassium: 4 mmol/L (ref 3.5–5.1)
Sodium: 135 mmol/L (ref 135–145)
Total Bilirubin: 0.2 mg/dL (ref 0.0–1.2)
Total Protein: 7 g/dL (ref 6.5–8.1)

## 2023-01-22 LAB — HCG, QUANTITATIVE, PREGNANCY: hCG, Beta Chain, Quant, S: 29630 m[IU]/mL — ABNORMAL HIGH (ref <5)

## 2023-01-22 MED ORDER — ONDANSETRON HCL 4 MG PO TABS: 4.0000 mg | ORAL_TABLET | Freq: Three times a day (TID) | ORAL | 0 refills | Status: AC | PRN

## 2023-01-22 MED ORDER — ACETAMINOPHEN 500 MG PO TABS: 1000.0000 mg | ORAL_TABLET | Freq: Four times a day (QID) | ORAL | Status: AC | PRN

## 2023-01-22 MED ORDER — METHOTREXATE FOR ECTOPIC PREGNANCY
50.0000 mg/m2 | Freq: Once | INTRAMUSCULAR | Status: AC
  Administered 2023-01-22: 97.5 mg via INTRAMUSCULAR
  Filled 2023-01-22: qty 3.9

## 2023-01-22 NOTE — MAU Note (Signed)
.  Meredith Gonzales is a 38 y.o. at [redacted]w[redacted]d here in MAU reporting: Pt reports she went to the bathroom and there was a blood clot she put on a glove and retrieved it form the toilet. She thinks it may be the embryo.   Onset of complaint: today Pain score: none There were no vitals filed for this visit.    Lab orders placed from triage:   none

## 2023-01-22 NOTE — MAU Provider Note (Addendum)
 History of Present Illness HPI: Meredith Gonzales is a 38 y.o. year old [id number] female at [redacted]w[redacted]d weeks gestation by LMP who presents to MAU reporting passage of tissue. Scant bleeding since then. The patient reports having collected the tissue in a container for examination. The patient denies abd pain but reports experiencing pain in the left hip, which she rates as a 7 out of 10. The patient has a history of a positive pregnancy test and an ultrasound showing a possible gestational sac in the uterus. The patient is scheduled for a follow-up ultrasound tomorrow. The patient also reports an allergy to latex, which causes large bumps on the skin.  Spanish interpreter used.   Previous US   US  OB LESS THAN 14 WEEKS WITH OB TRANSVAGINAL Result Date: 01/06/2023 CLINICAL DATA:  Vaginal bleeding for 3 days. Estimated gestational age by last menstrual period equals 5 weeks 0 days EXAM: OBSTETRIC <14 WK US  AND TRANSVAGINAL OB US  TECHNIQUE: Both transabdominal and transvaginal ultrasound examinations were performed for complete evaluation of the gestation as well as the maternal uterus, adnexal regions, and pelvic cul-de-sac. Transvaginal technique was performed to assess early pregnancy. COMPARISON:  None Available. FINDINGS: Intrauterine gestational sac: Stable sac-like structure within the endometrium Yolk sac:  Not identified Embryo:  Not identify MSD: 3.4 mm   5 w   0 d Subchorionic hemorrhage: Rounded echogenic complex adjacent to the presumed gestational sac within the endometrium measuring approximately 2 cm. Maternal uterus/adnexae: Corpus luteal cyst of the RIGHT ovary. Normal LEFT ovary. IMPRESSION: 1. Probable early intrauterine gestational sac, but no yolk sac, fetal pole, or cardiac activity yet visualized. Recommend follow-up quantitative B-HCG levels and follow-up US  in 14 days to assess viability. This recommendation follows SRU consensus guidelines: Diagnostic Criteria for Nonviable Pregnancy Early  in the First Trimester. LOISE Diedra PARAS Med 2013; 630:8556-48. 2. Echogenic material adjacent to the presumed gestational sac most consistent with a sub chorionic hemorrhage. 3. Corpus luteal cyst of the RIGHT ovary. Electronically Signed   By: Jackquline Boxer M.D.   On: 01/06/2023 16:46   Previous HCGs  Latest Reference Range & Units 01/06/23 14:12 01/08/23 11:46 01/10/23 11:28  hCG Quant mIU/mL  7,360 13,844  HCG, Beta Chain, Quant, S <5 mIU/mL 7,270 (H)    (H): Data is abnormally high  General: NAD Heart: Regular rate Lungs: Regular rate and effort Abd: Deferred Neuro: A&O x 4 GU: Deferred due to recent exam.   Results LABS Blood type: AB positive Results for orders placed or performed during the hospital encounter of 01/22/23 (from the past 24 hours)  CBC     Status: Abnormal   Collection Time: 01/22/23  7:25 PM  Result Value Ref Range   WBC 13.6 (H) 4.0 - 10.5 K/uL   RBC 4.46 3.87 - 5.11 MIL/uL   Hemoglobin 12.9 12.0 - 15.0 g/dL   HCT 61.8 63.9 - 53.9 %   MCV 85.4 80.0 - 100.0 fL   MCH 28.9 26.0 - 34.0 pg   MCHC 33.9 30.0 - 36.0 g/dL   RDW 87.1 88.4 - 84.4 %   Platelets 372 150 - 400 K/uL   nRBC 0.0 0.0 - 0.2 %  Comprehensive metabolic panel     Status: Abnormal   Collection Time: 01/22/23  7:25 PM  Result Value Ref Range   Sodium 135 135 - 145 mmol/L   Potassium 4.0 3.5 - 5.1 mmol/L   Chloride 103 98 - 111 mmol/L   CO2 22 22 - 32 mmol/L  Glucose, Bld 100 (H) 70 - 99 mg/dL   BUN 11 6 - 20 mg/dL   Creatinine, Ser 9.22 0.44 - 1.00 mg/dL   Calcium 8.9 8.9 - 89.6 mg/dL   Total Protein 7.0 6.5 - 8.1 g/dL   Albumin 3.5 3.5 - 5.0 g/dL   AST 20 15 - 41 U/L   ALT 24 0 - 44 U/L   Alkaline Phosphatase 62 38 - 126 U/L   Total Bilirubin 0.2 0.0 - 1.2 mg/dL   GFR, Estimated >39 >39 mL/min   Anion gap 10 5 - 15  hCG, quantitative, pregnancy     Status: Abnormal   Collection Time: 01/22/23  7:25 PM  Result Value Ref Range   hCG, Beta Chain, Quant, S 29,630 (H) <5 mIU/mL      RADIOLOGY US  OB Transvaginal Addendum Date: 01/22/2023 ADDENDUM REPORT: 01/22/2023 19:06 ADDENDUM: Critical Value/emergent results were called by telephone at the time of interpretation on 01/22/2023 at 7:06 pm to provider Sabri Teal  [facility] , who verbally acknowledged these results. Electronically Signed   By: Selinda DELENA Blue M.D.   On: 01/22/2023 19:06   Result Date: 01/22/2023 CLINICAL DATA:  38 year old pregnant female with vaginal bleeding for 1 day. Quantitative beta HCG 13,844 on 01/10/2023. EDC by LMP: 09/08/2023, projecting to an expected gestational age of [redacted] weeks 2 days. EXAM: TRANSVAGINAL OB ULTRASOUND TECHNIQUE: Transvaginal ultrasound was performed for complete evaluation of the gestation as well as the maternal uterus, adnexal regions, and pelvic cul-de-sac. COMPARISON:  None Available. FINDINGS: Intrauterine gestational sac: There is a 3.1 x 2.3 x 2.8 cm sac-like structure in the right fundal uterus with central cystic echoes and thick heterogeneous wall with associated mural hypervascularity on color Doppler, without surrounding myometrium, most compatible with a right cornual ectopic pregnancy. Yolk sac:  Not Visualized. Embryo:  Not Visualized. Cardiac Activity: Not Visualized. Subchorionic hemorrhage:  None visualized. Maternal uterus/adnexae: Left ovary measures 2.8 x 1.7 x 2.1 cm and is normal. Right ovary measures 2.9 x 2.1 x 2.3 cm and contains a corpus luteum. No suspicious ovarian or adnexal masses. No abnormal free fluid in the pelvis. Bilayer endometrial thickness 11 mm. Trace fluid in the endocervical canal. IMPRESSION: Suspected right cornual ectopic pregnancy. Sac-like thick-walled 3.1 cm structure in the right fundal uterus without myometrial rim. No yolk sac or embryo detected. Normal ovaries. No suspicious adnexal masses. No abnormal free fluid the pelvis. Electronically Signed: By: Selinda DELENA Blue M.D. On: 01/22/2023 18:56   MAU Course; Notified of US  highly suspicious for cornual  ectopic pregnancy and with no development of embryo since previous US . Discussed with Dr. Cleatus who reviewed US  and agrees w/ Dx and that this is a non-viable pregnancy. Discussed Tx options. Due to mass 3.1 cm and pt hemodynamically stable and minimal pain and considering significant risks of surgery for cornual ectopic for some one who desires future pregnancy (as this pt does), she recommends MTX. Discussed US  results and R/B/I for MTX vs surgery. Pt still uncertain. Having significant anxiety about MTX being a chemotherapeutic agent due to recent CA Tx for her child who then passed away.  Dr. Cleatus came to [facility] to discus options and address questions. Pt agrees to MTX. Ectopic precautions reviewed carefully including risk of large, potentially life-threatening hemorrhage with ruptured cornual ectopic pregnancy. Pt appears reliable for F/U and verbalized understanding of close F/U of hCGs and that surgery may become necessary of MTX does not work or if she shows signs of rupture.  Assessment and Plan 1. Pregnancy, ectopic, cornual or cervical   2. Vaginal bleeding in pregnancy, first trimester   3. Inappropriate change in quantitative human chorionic gonadotropin  (hCG) in early pregnancy   4. Language barrier     Plan  Treatment day  Single dose protocol   1  hCG.  Administer Methotrexate  50 mg/m2 body surface area IM  4  hCG  7  hCG  If <15 percent hCG decline from day 4 to 7, give additional dose of methotrexate  50 mg/m2 IM  If >=15 percent hCG decline from day 4 to 7, draw hCG weekly until undetectable  14  hCG  If <15 percent hCG decline from day 7 to 14, give additional dose of methotrexate  50 mg/m2 IM  If >=15 percent hCG decline from day 7 to 14, check hCG weekly until undetectable  21 and 28  If 3 doses have been given and there is a <15 percent hCG decline from day 21 to 28, proceed with laparoscopic surgery  Laparoscopy  If severe abdominal pain or an acute abdomen suggestive  of tubal rupture occurs If ultrasonography reveals greater than 300 mL pelvic or other intraperitoneal fluid  The hCG concentration usually declines to less than 15 mIU/mL by 35 days postinjection but may take as long as 109 days. If the hCG does not decline to zero, a new pregnancy should be excluded; if the hCG is rising, a transvaginal ultrasound should be performed. Alternatively, some patients have a slow clearance of serum hCG. If three weekly values are similar, consider an additional dose of MTX (50 mg/m2) not to exceed the recommended maximum of three total doses. This typically accelerates the decline of serum hCG. The risk of gestational trophoblastic disease is low. Folinic acid rescue is not required for women treated with the single-dose protocol, even if multiple doses are ultimately given.    Prepared with data from:   [facility]. Clinical practice. Ectopic pregnancy. LOISE Diedra PARAS Med 2009; 361:379  American College of Obstetricians and Gynecologists. ACOG Practice Bulletin No. 94: Medical management of ectopic pregnancy. Obstet Gynecol 2008; 888:8520.   The risks of methotrexate  were reviewed including failure requiring repeat dosing or eventual surgery. She understands that methotrexate  involves frequent return visits to monitor lab values and that she remains at risk of ectopic rupture until her beta is less than assay. ?The patient opts to proceed with methotrexate .  She has no history of hepatic or renal dysfunction, has normal BUN/Cr/LFT's/platelets.  She is felt to be reliable for follow-up. Side effects of photosensitivity & GI upset were discussed.  She knows to avoid direct sunlight and abstain from alcohol, NSAIDs and sexual intercourse for two weeks. She was counseled to discontinue any MVI with folic acid. ?She understands to follow up on D4 (Saturday) and D7 (Tuesday) for repeat BHCG and was given the instruction sheet. ?Strict ectopic precautions were reviewed, the patient knows  to call with any abdominal pain, vomiting, fainting, or any concerns with her health.    Follow-up Information     [facility] Follow up on 01/25/2023.   Specialty: Obstetrics and Gynecology Why: Saturday 01/25/2023 and Tuesday 01/28/2023 for repeat blood work for ectopic pregnancy or sooner as needed for worsening abdominal pain, heavy bleeding or fevere greater that 100.4. Contact information: [address] [place]  [phone]               Allergies as of 01/22/2023  Reactions   Latex Hives   Other    Anesthesia-hypotension         Medication List     STOP taking these medications    folic acid 1 MG tablet Commonly known as: FOLVITE   Prenatal Complete 14-0.4 MG Tabs       TAKE these medications    acetaminophen  500 MG tablet Commonly known as: TYLENOL  Take 2 tablets (1,000 mg total) by mouth every 6 (six) hours as needed.   ondansetron  4 MG tablet Commonly known as: Zofran  Take 1-2 tablets (4-8 mg total) by mouth every 8 (eight) hours as needed for nausea or vomiting.   sertraline  100 MG tablet Commonly known as: ZOLOFT  Take 1 tablet (100 mg total) by mouth daily.        Divon Krabill  Claudene HOWARD 01/22/2023 10:43 PM

## 2023-01-23 ENCOUNTER — Other Ambulatory Visit: Payer: BLUE CROSS/BLUE SHIELD

## 2023-01-25 ENCOUNTER — Inpatient Hospital Stay (HOSPITAL_COMMUNITY)
Admit: 2023-01-25 | Discharge: 2023-01-25 | Disposition: A | Discharge status: Home / Self Care | Payer: BLUE CROSS/BLUE SHIELD | Attending: Obstetrics & Gynecology

## 2023-01-25 ENCOUNTER — Inpatient Hospital Stay (HOSPITAL_COMMUNITY)
Admission: AD | Admit: 2023-01-25 | Discharge: 2023-01-25 | Disposition: A | Discharge status: Home / Self Care | Payer: BLUE CROSS/BLUE SHIELD | Attending: Obstetrics & Gynecology | Admitting: Obstetrics & Gynecology

## 2023-01-25 DIAGNOSIS — Z758 Other problems related to medical facilities and other health care: Secondary | ICD-10-CM | POA: Diagnosis not present

## 2023-01-25 DIAGNOSIS — Z603 Acculturation difficulty: Secondary | ICD-10-CM | POA: Diagnosis not present

## 2023-01-25 DIAGNOSIS — Z3A01 Less than 8 weeks gestation of pregnancy: Secondary | ICD-10-CM | POA: Diagnosis not present

## 2023-01-25 DIAGNOSIS — O009 Unspecified ectopic pregnancy without intrauterine pregnancy: Secondary | ICD-10-CM | POA: Insufficient documentation

## 2023-01-25 DIAGNOSIS — R7989 Other specified abnormal findings of blood chemistry: Secondary | ICD-10-CM | POA: Diagnosis not present

## 2023-01-25 LAB — HCG, QUANTITATIVE, PREGNANCY: hCG, Beta Chain, Quant, S: 25388 m[IU]/mL — ABNORMAL HIGH (ref <5)

## 2023-01-25 NOTE — MAU Note (Signed)
 Meredith Gonzales is a 38 y.o. at [redacted]w[redacted]d here in MAU reporting: here for blood work, day 4 post methotrexate .  Had GI symptoms, nausea  and loose stools, is feeling better now.  Some cramping in lower abd. Dk coffee colored blood when she wipes.  Onset of complaint: ongoing Pain score: 4 Vitals:   01/25/23 0739  BP: 128/81  Pulse: 81  Resp: 16  Temp: 97.9 F (36.6 C)  SpO2: 100%      Lab orders placed from triage:  blood drawn in triage

## 2023-01-25 NOTE — MAU Provider Note (Signed)
 [name] presents to MAU for follow-up quant hCG blood draw today. She was seen in MAU for vaginal bleeding on 01/22/2023 and diagnosed with a Right cornual ectopic pregnancy and given Methotrexate .  Today Patient endorses some mild on-going abdominal pain that is tolerable and that she is not medicating for and some dark brown coffee color light spotting with wiping. Discussed with patient, we are following hCG levels today. Results will be back in approximately 2 hours.    Quant Results  01/22/2023- 29,630 (Methotrexate  Given) 01/25/2023- 74,661 - appropriate decline in Quant today.   Consulted Dr. Jayne on management given high Quant and Location. MD reviewed lab results and previous US  imaging. Per MD high suspicion that patient will likely need a 2nd Methotrexate  dose at some point given elevated Quant and US  imaging, however not necessary at this time.   Continue with plan for Repeat Quant on Day 7.   Reviewed results with patient and plan of care. Discussed that patient may also need a repeat US  on day 7. Patient reports that was also previously explained to her.   1. Ectopic pregnancy, unspecified location, unspecified whether intrauterine pregnancy present   2. [redacted] weeks gestation of pregnancy   3. Elevated serum hCG   4. Language barrier    - Given patient acuity and concern for Quant being so high, recommended that patient follow up at MAU instead of outpatient clinic. Patient verbalized understanding.  - Reviewed worsening sign and return precautions.  - Due to language barrier, an interpreter was present during the history-taking and subsequent discussion (and for part of the physical exam) with this patient. - In person spanish interpreter Shanda present for the duration of this visit.  - Patient discharged home in stable condition and may return to MAU as needed.   [name], MSN CNM  01/25/2023 10:44 AM

## 2023-01-28 ENCOUNTER — Inpatient Hospital Stay (HOSPITAL_COMMUNITY): Payer: BLUE CROSS/BLUE SHIELD | Attending: Obstetrics & Gynecology

## 2023-01-28 ENCOUNTER — Inpatient Hospital Stay (HOSPITAL_COMMUNITY)
Admission: AD | Admit: 2023-01-28 | Discharge: 2023-01-28 | Disposition: A | Discharge status: Home / Self Care | Payer: BLUE CROSS/BLUE SHIELD | Attending: Obstetrics & Gynecology | Admitting: Obstetrics & Gynecology

## 2023-01-28 DIAGNOSIS — Z758 Other problems related to medical facilities and other health care: Secondary | ICD-10-CM | POA: Diagnosis not present

## 2023-01-28 DIAGNOSIS — O009 Unspecified ectopic pregnancy without intrauterine pregnancy: Secondary | ICD-10-CM | POA: Diagnosis not present

## 2023-01-28 DIAGNOSIS — Z603 Acculturation difficulty: Secondary | ICD-10-CM | POA: Diagnosis not present

## 2023-01-28 DIAGNOSIS — O008 Other ectopic pregnancy without intrauterine pregnancy: Secondary | ICD-10-CM | POA: Diagnosis present

## 2023-01-28 DIAGNOSIS — Z3A01 Less than 8 weeks gestation of pregnancy: Secondary | ICD-10-CM

## 2023-01-28 LAB — HCG, QUANTITATIVE, PREGNANCY: hCG, Beta Chain, Quant, S: 17795 m[IU]/mL — ABNORMAL HIGH (ref <5)

## 2023-01-28 NOTE — MAU Provider Note (Signed)
 History     CSN: [id number]  Arrival date and time: 01/28/23 1101   Event Date/Time   First Provider Initiated Contact with Patient 01/28/2023 11:10 AM   Chief Complaint  Patient presents with   Follow-up    HPI  Meredith Gonzales is a 38 y.o. [id number] at [redacted]w[redacted]d who presents to the MAU for f/up hCG. Pt here for day 7 quant after Methotrexate  for right cornual ectopic pregnancy. She reports pain improving since last visit. No VB. No f/c, n/v, abd pain, c/d, urinary sxs.  Past Medical History:  Diagnosis Date   Breast changes, fibrocystic    Complication of anesthesia    causes bp to go down per patient and has to have med to raise bp   Depression    Ectopic pregnancy 2016   UTI (urinary tract infection)     Past Surgical History:  Procedure Laterality Date   CESAREAN SECTION     2012 and 2014 - c/s x 2   DILATION AND EVACUATION N/A 09/12/2020   Procedure: DILATATION AND EVACUATION;  Surgeon: Cleotilde Ronal RAMAN, MD;  Location: [facility] OR;  Service: Gynecology;  Laterality: N/A;   LAPAROSCOPIC ABDOMINAL EXPLORATION  2016   removal of ectopic pregnancy   LAPAROSCOPY Right 03/25/2014   Procedure: LAPAROSCOPY OPERATIVE with removal of right ovarian cyst wall;  Surgeon: Glenys RAMAN Birk, MD;  Location: [facility] ORS;  Service: Gynecology;  Laterality: Right;    Family History  Problem Relation Age of Onset   Heart disease Mother        pacemaker   Diabetes Mother    Healthy Father    Cancer Son    Down syndrome Son     Social History   Tobacco Use   Smoking status: Never   Smokeless tobacco: Never  Vaping Use   Vaping status: Never Used  Substance Use Topics   Alcohol use: No   Drug use: No    Allergies:  Allergies  Allergen Reactions   Latex Hives   Other     Anesthesia-hypotension     No medications prior to admission.    ROS reviewed and pertinent positives and negatives as documented in HPI.  Physical Exam   Blood pressure 122/77, pulse 85, temperature 97.9 F  (36.6 C), temperature source Oral, resp. rate 17, height 5' 4 (1.626 m), weight 86.2 kg, last menstrual period 12/02/2022, SpO2 100%, unknown if currently breastfeeding.  Physical Exam Constitutional:      General: She is not in acute distress.    Appearance: Normal appearance. She is not ill-appearing.  HENT:     Head: Normocephalic and atraumatic.  Cardiovascular:     Rate and Rhythm: Normal rate.  Pulmonary:     Effort: Pulmonary effort is normal.     Breath sounds: Normal breath sounds.  Abdominal:     Palpations: Abdomen is soft.     Tenderness: There is no abdominal tenderness. There is no guarding.  Musculoskeletal:        General: Normal range of motion.  Skin:    General: Skin is warm and dry.     Findings: No rash.  Neurological:     General: No focal deficit present.     Mental Status: She is alert and oriented to person, place, and time.     MAU Course  Procedures  MDM 38 y.o. [id number] at [redacted]w[redacted]d presenting for repeat b-hCG on day 7 following methotrexate  for right cornual ectopic pregnancy. Quant appropriately downtrended between day  1 and 4. Pt tolerated methotrexate  well, no concerns, pain mostly resolved, no new symptoms. She is well appearing and has a benign abdominal exam. Today, her quant appropriately decreased by approx 30%. Given improving symptoms and appropriately dropping quants, will continue to follow weekly in clinic. Pt given extensive return precautions and advised to not attempt to conceive until quant is 0, pt expressed understanding. Stable for d/c.  Assessment and Plan  Ectopic pregnancy, unspecified location, unspecified whether intrauterine pregnancy present Quants dropping appropriately Continue weekly f/up Next appt at [facility] on 1/14, pt aware   Alain Sor, MD OB Fellow, Faculty Practice [facility], Center for [facility]  01/28/2023, 4:58 PM

## 2023-02-04 ENCOUNTER — Ambulatory Visit: Payer: BLUE CROSS/BLUE SHIELD

## 2023-02-04 ENCOUNTER — Other Ambulatory Visit: Payer: Self-pay | Admitting: Family Medicine

## 2023-02-04 ENCOUNTER — Other Ambulatory Visit: Payer: Self-pay

## 2023-02-04 VITALS — BP 126/76 | HR 77 | Wt 189.5 lb

## 2023-02-04 DIAGNOSIS — O009 Unspecified ectopic pregnancy without intrauterine pregnancy: Secondary | ICD-10-CM

## 2023-02-04 DIAGNOSIS — Z3A01 Less than 8 weeks gestation of pregnancy: Secondary | ICD-10-CM

## 2023-02-04 NOTE — Progress Notes (Signed)
 Beta HCG Follow-up Visit  Tashanda Kateryn Marasigan presents to [facility] for follow-up beta HCG lab. She is 14 days s/p methotrexate  injection for ectopic pregnancy. Lab drawn. Zoila present for in person encounter. HCG today is 2838, which has decreased from 17795 on 01/28/23.   Vernell FORBES Ruddle 02/04/2023 8:34 AM   Addend: Reviewed with Lola MD on 02/05/23; provider recommends weekly HCG until negative. Called pt with Spanish interpreter [name]; recommendations given. Appts scheduled. Offered follow up visit with provider which patient desires. Front office notified to schedule.   Vernell RN 02/05/23

## 2023-02-05 LAB — BETA HCG QUANT (REF LAB): hCG Quant: 2838 m[IU]/mL

## 2023-02-11 ENCOUNTER — Other Ambulatory Visit: Payer: BLUE CROSS/BLUE SHIELD

## 2023-02-12 ENCOUNTER — Other Ambulatory Visit: Payer: Self-pay

## 2023-02-12 ENCOUNTER — Other Ambulatory Visit: Payer: BLUE CROSS/BLUE SHIELD

## 2023-02-12 DIAGNOSIS — O00101 Right tubal pregnancy without intrauterine pregnancy: Secondary | ICD-10-CM

## 2023-02-13 ENCOUNTER — Telehealth: Payer: Self-pay

## 2023-02-13 LAB — BETA HCG QUANT (REF LAB): hCG Quant: 583 m[IU]/mL

## 2023-02-13 NOTE — Telephone Encounter (Signed)
-----   Message from Venora Maples sent at 02/13/2023  8:46 AM EST ----- Downtrending appropriately Clinical staff please coordinate follow up hcg in one week, trend to 0

## 2023-02-13 NOTE — Telephone Encounter (Signed)
Called Pt using Spanish [address] Onalee Hua id# [id number] to go over HCG results trending downward & that  someone will be contacting her to schedule another visit in a week, no answer,VM full could not leave a message. Sent Pts chart to Admin to get scheduled in 1 week for HCG.

## 2023-02-17 ENCOUNTER — Other Ambulatory Visit: Payer: Self-pay

## 2023-02-17 DIAGNOSIS — O00101 Right tubal pregnancy without intrauterine pregnancy: Secondary | ICD-10-CM

## 2023-02-18 ENCOUNTER — Other Ambulatory Visit: Payer: BLUE CROSS/BLUE SHIELD

## 2023-02-25 ENCOUNTER — Other Ambulatory Visit: Payer: BLUE CROSS/BLUE SHIELD

## 2023-02-25 ENCOUNTER — Other Ambulatory Visit: Payer: Self-pay

## 2023-02-25 DIAGNOSIS — O039 Complete or unspecified spontaneous abortion without complication: Secondary | ICD-10-CM

## 2023-02-26 LAB — BETA HCG QUANT (REF LAB): hCG Quant: 142 m[IU]/mL

## 2023-02-28 ENCOUNTER — Telehealth: Payer: Self-pay

## 2023-02-28 ENCOUNTER — Other Ambulatory Visit: Payer: Self-pay

## 2023-02-28 DIAGNOSIS — O039 Complete or unspecified spontaneous abortion without complication: Secondary | ICD-10-CM

## 2023-02-28 NOTE — Telephone Encounter (Addendum)
 Called patient using in house Spanish interpreter Claude. Called patient that her HCG level are trending down and the physician wants her to come in next week until levels are down to 0. Patient verbalized understanding. Lab appointment scheduled for 2/11 at 11:00 AM. Patient confirmed scheduled appointment.   Rosaline, RN   ----- Message from Donnice CHRISTELLA Carolus sent at 02/27/2023  1:15 PM EST ----- Downtrending appropriately Clinical staff please coordinate follow up hcg in one week, trend to 0

## 2023-03-04 ENCOUNTER — Other Ambulatory Visit: Payer: Self-pay

## 2023-03-04 ENCOUNTER — Other Ambulatory Visit: Payer: BLUE CROSS/BLUE SHIELD

## 2023-03-04 DIAGNOSIS — O039 Complete or unspecified spontaneous abortion without complication: Secondary | ICD-10-CM

## 2023-03-05 LAB — BETA HCG QUANT (REF LAB): hCG Quant: 83 m[IU]/mL

## 2023-03-06 ENCOUNTER — Ambulatory Visit (INDEPENDENT_AMBULATORY_CARE_PROVIDER_SITE_OTHER): Payer: BLUE CROSS/BLUE SHIELD | Admitting: Obstetrics & Gynecology

## 2023-03-06 ENCOUNTER — Telehealth: Payer: Self-pay | Admitting: General Practice

## 2023-03-06 ENCOUNTER — Other Ambulatory Visit: Payer: Self-pay

## 2023-03-06 VITALS — BP 135/84 | HR 101 | Wt 187.0 lb

## 2023-03-06 DIAGNOSIS — Z3A13 13 weeks gestation of pregnancy: Secondary | ICD-10-CM | POA: Diagnosis not present

## 2023-03-06 DIAGNOSIS — O008 Other ectopic pregnancy without intrauterine pregnancy: Secondary | ICD-10-CM

## 2023-03-06 DIAGNOSIS — Z603 Acculturation difficulty: Secondary | ICD-10-CM | POA: Diagnosis not present

## 2023-03-06 DIAGNOSIS — Z758 Other problems related to medical facilities and other health care: Secondary | ICD-10-CM

## 2023-03-06 DIAGNOSIS — Z1331 Encounter for screening for depression: Secondary | ICD-10-CM | POA: Diagnosis not present

## 2023-03-06 NOTE — Telephone Encounter (Signed)
-----   Message from Venora Maples sent at 03/06/2023  8:25 AM EST ----- Downtrending appropriately Clinical staff please coordinate follow up hcg in one week, trend to 0

## 2023-03-06 NOTE — Progress Notes (Signed)
Ultrasounds Results Note  SUBJECTIVE HPI:  [name] is a 38 y.o. [id number] at [redacted]w[redacted]d by LMP who presents to the [facility] for followup after methotrexate given for cornual ectopic pregnancy. The patient denies abdominal pain or vaginal bleeding.  1/1- 29,630(methotrexate)   1/4- 45,409 1/7- 17,795 1/14 - 2,838 1/23 - 583 2/4 - 142 2/11 - 83  Past Medical History:  Diagnosis Date   Breast changes, fibrocystic    Complication of anesthesia    causes bp to go down per patient and has to have med to raise bp   Depression    Ectopic pregnancy 2016   UTI (urinary tract infection)    Past Surgical History:  Procedure Laterality Date   CESAREAN SECTION     2012 and 2014 - c/s x 2   DILATION AND EVACUATION N/A 09/12/2020   Procedure: DILATATION AND EVACUATION;  Surgeon: Jerene Bears, MD;  Location: [facility] OR;  Service: Gynecology;  Laterality: N/A;   LAPAROSCOPIC ABDOMINAL EXPLORATION  2016   removal of ectopic pregnancy   LAPAROSCOPY Right 03/25/2014   Procedure: LAPAROSCOPY OPERATIVE with removal of right ovarian cyst wall;  Surgeon: Reva Bores, MD;  Location: [facility] ORS;  Service: Gynecology;  Laterality: Right;   Social History   Socioeconomic History   Marital status: Married    Spouse name: Not on file   Number of children: Not on file   Years of education: Not on file   Highest education level: Not on file  Occupational History   Not on file  Tobacco Use   Smoking status: Never   Smokeless tobacco: Never  Vaping Use   Vaping status: Never Used  Substance and Sexual Activity   Alcohol use: No   Drug use: No   Sexual activity: Yes    Birth control/protection: None    Comment: 14 wsks gestation  Other Topics Concern   Not on file  Social History Narrative   Not on file   Social Drivers of Health   Financial Resource Strain: Not on file  Food Insecurity: Not on file  Transportation Needs: Not on file  Physical Activity: Not on file   Stress: Not on file  Social Connections: Unknown (06/05/2021)   Received from [facility]   Social Network    Social Network: Not on file  Intimate Partner Violence: Unknown (04/27/2021)   Received from [facility]   HITS    Physically Hurt: Not on file    Insult or Talk Down To: Not on file    Threaten Physical Harm: Not on file    Scream or Curse: Not on file   Current Outpatient Medications on File Prior to Visit  Medication Sig Dispense Refill   acetaminophen (TYLENOL) 500 MG tablet Take 2 tablets (1,000 mg total) by mouth every 6 (six) hours as needed.     doxepin (SINEQUAN) 10 MG capsule Take 10 mg by mouth at bedtime.     desvenlafaxine (PRISTIQ) 100 MG 24 hr tablet Take 100 mg by mouth daily. (Patient not taking: Reported on 03/06/2023)     ondansetron (ZOFRAN) 4 MG tablet Take 1-2 tablets (4-8 mg total) by mouth every 8 (eight) hours as needed for nausea or vomiting. (Patient not taking: Reported on 03/06/2023) 20 tablet 0   No current facility-administered medications on file prior to visit.   Allergies  Allergen Reactions   Latex Hives   Other     Anesthesia-hypotension     I  have reviewed patient's Past Medical Hx, Surgical Hx, Family Hx, Social Hx, medications and allergies.   Review of Systems Review of Systems  Constitutional: Negative for fever and chills.  Gastrointestinal: Negative for nausea, vomiting, abdominal pain, diarrhea and constipation.  Genitourinary: Negative for dysuria.  Musculoskeletal: Negative for back pain.  Neurological: Negative for dizziness and weakness.    Physical Exam  BP (!) 147/108   Pulse (!) 102   Wt 187 lb (84.8 kg)   LMP 12/02/2022 (Exact Date)   BMI 32.10 kg/m   GENERAL: Well-developed, well-nourished female in no acute distress.  HEENT: Normocephalic, atraumatic.   LUNGS: Effort normal ABDOMEN: soft, non-tender HEART: Regular rate  SKIN: Warm, dry and without erythema PSYCH: Normal mood and affect NEURO: Alert  and oriented x 4  LAB RESULTS No results found for this or any previous visit (from the past 24 hours).  IMAGING No results found.  ASSESSMENT 1. Language barrier   2. Other ectopic pregnancy, unspecified whether intrauterine pregnancy present   3 Cornual ectopic pregnancy s/x methotrexate  PLAN Discharge home in stable condition Patient advised to start/continue taking prenatal vitamins Consider referral to Dr. April Manson F/u HCG next week  Adam Phenix, MD  03/06/2023  5:00 PM

## 2023-03-06 NOTE — Telephone Encounter (Signed)
Called patient with [name] assisting with spanish interpretation & informed her of results. Discussed scheduling follow up lab appts when she comes to the office later this afternoon. Patient verbalized understanding.

## 2023-03-11 ENCOUNTER — Other Ambulatory Visit: Payer: Self-pay

## 2023-03-11 DIAGNOSIS — O008 Other ectopic pregnancy without intrauterine pregnancy: Secondary | ICD-10-CM

## 2023-03-13 ENCOUNTER — Other Ambulatory Visit: Payer: BLUE CROSS/BLUE SHIELD

## 2023-03-13 ENCOUNTER — Other Ambulatory Visit: Payer: Self-pay

## 2023-03-13 DIAGNOSIS — O008 Other ectopic pregnancy without intrauterine pregnancy: Secondary | ICD-10-CM

## 2023-03-14 ENCOUNTER — Other Ambulatory Visit: Payer: Self-pay

## 2023-03-14 ENCOUNTER — Telehealth: Payer: Self-pay

## 2023-03-14 LAB — BETA HCG QUANT (REF LAB): hCG Quant: 39 m[IU]/mL

## 2023-03-14 NOTE — Telephone Encounter (Addendum)
 Called patient with in house Spanish interpreter Tobi Bastos. Called patient regarding her recent lab results. Notified patient her HCG levels are trending down and the physician would like her to come in again in 1 week for another lab and weekly until 0. Patient verbalized understanding. Scheduled patient lab appointment for 2/27 at 11:00. Patient confirmed scheduled appointment.   Marcelino Duster, RN  ----- Message from Venora Maples sent at 03/14/2023  8:49 AM EST ----- Downtrending appropriately s/p methotrexate Clinical staff please coordinate follow up hcg in one week, trend to 0

## 2023-03-19 ENCOUNTER — Other Ambulatory Visit: Payer: Self-pay

## 2023-03-19 DIAGNOSIS — Z3A15 15 weeks gestation of pregnancy: Secondary | ICD-10-CM

## 2023-03-20 ENCOUNTER — Other Ambulatory Visit: Payer: Self-pay

## 2023-03-20 ENCOUNTER — Other Ambulatory Visit: Payer: BLUE CROSS/BLUE SHIELD

## 2023-03-20 DIAGNOSIS — Z3A15 15 weeks gestation of pregnancy: Secondary | ICD-10-CM

## 2023-03-21 LAB — BETA HCG QUANT (REF LAB): hCG Quant: 17 m[IU]/mL

## 2023-03-24 ENCOUNTER — Other Ambulatory Visit: Payer: Self-pay | Admitting: Advanced Practice Midwife

## 2023-03-24 DIAGNOSIS — O008 Other ectopic pregnancy without intrauterine pregnancy: Secondary | ICD-10-CM

## 2023-03-26 ENCOUNTER — Telehealth: Payer: Self-pay

## 2023-03-26 NOTE — Telephone Encounter (Addendum)
 Called patient with in house Spanish interpreter, [name] and notify patient of a appointment. Patient reports that she came in office last week on 03/20/23 for her HCG lab draw. Informed patient that we will notify patient of results once our providers review the results. Patient voiced understanding.  Marcelino Duster, RN  ----- Message from [place] sent at 03/24/2023  4:32 PM EST ----- Regarding: Needs Qaunt ASAP S/P MTX for ectopic. Needs quant (was due for draw today.)

## 2023-04-12 ENCOUNTER — Other Ambulatory Visit: Payer: Self-pay

## 2023-04-12 ENCOUNTER — Emergency Department (HOSPITAL_COMMUNITY)

## 2023-04-12 ENCOUNTER — Emergency Department (HOSPITAL_COMMUNITY)
Admission: EM | Admit: 2023-04-12 | Discharge: 2023-04-12 | Disposition: A | Discharge status: Home / Self Care | Attending: Emergency Medicine | Admitting: Emergency Medicine

## 2023-04-12 DIAGNOSIS — D72829 Elevated white blood cell count, unspecified: Secondary | ICD-10-CM | POA: Diagnosis not present

## 2023-04-12 DIAGNOSIS — R059 Cough, unspecified: Secondary | ICD-10-CM | POA: Diagnosis present

## 2023-04-12 DIAGNOSIS — J069 Acute upper respiratory infection, unspecified: Secondary | ICD-10-CM | POA: Insufficient documentation

## 2023-04-12 DIAGNOSIS — Z91040 Latex allergy status: Secondary | ICD-10-CM | POA: Diagnosis not present

## 2023-04-12 LAB — CBC WITH DIFFERENTIAL/PLATELET
Abs Immature Granulocytes: 0.1 10*3/uL — ABNORMAL HIGH (ref 0.00–0.07)
Basophils Absolute: 0.1 10*3/uL (ref 0.0–0.1)
Basophils Relative: 1 %
Eosinophils Absolute: 0.3 10*3/uL (ref 0.0–0.5)
Eosinophils Relative: 2 %
HCT: 38.4 % (ref 36.0–46.0)
Hemoglobin: 12.6 g/dL (ref 12.0–15.0)
Immature Granulocytes: 1 %
Lymphocytes Relative: 42 %
Lymphs Abs: 5 10*3/uL — ABNORMAL HIGH (ref 0.7–4.0)
MCH: 28.5 pg (ref 26.0–34.0)
MCHC: 32.8 g/dL (ref 30.0–36.0)
MCV: 86.9 fL (ref 80.0–100.0)
Monocytes Absolute: 0.7 10*3/uL (ref 0.1–1.0)
Monocytes Relative: 6 %
Neutro Abs: 5.9 10*3/uL (ref 1.7–7.7)
Neutrophils Relative %: 48 %
Platelets: 354 10*3/uL (ref 150–400)
RBC: 4.42 MIL/uL (ref 3.87–5.11)
RDW: 12.8 % (ref 11.5–15.5)
WBC: 12.1 10*3/uL — ABNORMAL HIGH (ref 4.0–10.5)
nRBC: 0 % (ref 0.0–0.2)

## 2023-04-12 LAB — BASIC METABOLIC PANEL
Anion gap: 6 (ref 5–15)
BUN: <5 mg/dL — ABNORMAL LOW (ref 6–20)
CO2: 23 mmol/L (ref 22–32)
Calcium: 8 mg/dL — ABNORMAL LOW (ref 8.9–10.3)
Chloride: 108 mmol/L (ref 98–111)
Creatinine, Ser: 0.67 mg/dL (ref 0.44–1.00)
GFR, Estimated: >60 mL/min (ref >60)
Glucose, Bld: 111 mg/dL — ABNORMAL HIGH (ref 70–99)
Potassium: 3.9 mmol/L (ref 3.5–5.1)
Sodium: 137 mmol/L (ref 135–145)

## 2023-04-12 LAB — RESP PANEL BY RT-PCR (RSV, FLU A&B, COVID)  RVPGX2
Influenza A by PCR: NEGATIVE
Influenza B by PCR: NEGATIVE
Resp Syncytial Virus by PCR: NEGATIVE
SARS Coronavirus 2 by RT PCR: NEGATIVE

## 2023-04-12 LAB — HCG, SERUM, QUALITATIVE: Preg, Serum: NEGATIVE

## 2023-04-12 LAB — GROUP A STREP BY PCR: Group A Strep by PCR: NOT DETECTED

## 2023-04-12 MED ORDER — BENZONATATE 100 MG PO CAPS: 100.0000 mg | ORAL_CAPSULE | Freq: Three times a day (TID) | ORAL | 0 refills | Status: AC

## 2023-04-12 NOTE — ED Triage Notes (Signed)
 Patient reports persistent productive cough with sore throat , chest congestion and runny nose for 3 days .

## 2023-04-12 NOTE — ED Provider Notes (Signed)
 [place] EMERGENCY DEPARTMENT AT [facility] Provider Note   CSN: [id number] Arrival date & time: 04/12/23  0419     History  Chief Complaint  Patient presents with   Productive Cough / Sore Throat    Meredith Gonzales is a 38 y.o. female presents today for productive cough, sore throat, congestion, body aches and rhinorrhea x 3 days.  Patient denies shortness of breath, chest pain, nausea, vomiting, diarrhea, abdominal pain, weakness, or headache.  HPI gathered using certified language interpreter.  HPI     Home Medications Prior to Admission medications   Medication Sig Start Date End Date Taking? Authorizing Provider  benzonatate (TESSALON) 100 MG capsule Take 1 capsule (100 mg total) by mouth every 8 (eight) hours. 04/12/23  Yes Dolphus Jenny, PA-C  acetaminophen (TYLENOL) 500 MG tablet Take 2 tablets (1,000 mg total) by mouth every 6 (six) hours as needed. 01/22/23   Katrinka Blazing, [place], CNM  desvenlafaxine (PRISTIQ) 100 MG 24 hr tablet Take 100 mg by mouth daily. Patient not taking: Reported on 03/06/2023 01/12/23   [provider]  doxepin (SINEQUAN) 10 MG capsule Take 10 mg by mouth at bedtime. 01/29/23   [provider]  ondansetron (ZOFRAN) 4 MG tablet Take 1-2 tablets (4-8 mg total) by mouth every 8 (eight) hours as needed for nausea or vomiting. Patient not taking: Reported on 03/06/2023 01/22/23   Katrinka Blazing [place], CNM      Allergies    Latex and Other    Review of Systems   Review of Systems  HENT:  Positive for congestion, rhinorrhea and sore throat.   Respiratory:  Positive for cough.     Physical Exam Updated Vital Signs BP (!) 116/90   Pulse 77   Temp 98.4 F (36.9 C)   Resp 18   LMP 12/02/2022 (Exact Date)   SpO2 99%  Physical Exam Vitals and nursing note reviewed.  Constitutional:      General: She is not in acute distress.    Appearance: She is well-developed.  HENT:     Head: Normocephalic and atraumatic.  Eyes:      Conjunctiva/sclera: Conjunctivae normal.  Cardiovascular:     Rate and Rhythm: Normal rate and regular rhythm.     Heart sounds: No murmur heard. Pulmonary:     Effort: Pulmonary effort is normal. No respiratory distress.     Breath sounds: Normal breath sounds.  Abdominal:     Palpations: Abdomen is soft.     Tenderness: There is no abdominal tenderness.  Musculoskeletal:        General: No swelling.     Cervical back: Neck supple.  Skin:    General: Skin is warm and dry.     Capillary Refill: Capillary refill takes less than 2 seconds.  Neurological:     Mental Status: She is alert.  Psychiatric:        Mood and Affect: Mood normal.     ED Results / Procedures / Treatments   Labs (all labs ordered are listed, but only abnormal results are displayed) Labs Reviewed  CBC WITH DIFFERENTIAL/PLATELET - Abnormal; Notable for the following components:      Result Value   WBC 12.1 (*)    Lymphs Abs 5.0 (*)    Abs Immature Granulocytes 0.10 (*)    All other components within normal limits  BASIC METABOLIC PANEL - Abnormal; Notable for the following components:   Glucose, Bld 111 (*)    BUN <5 (*)  Calcium 8.0 (*)    All other components within normal limits  GROUP A STREP BY PCR  RESP PANEL BY RT-PCR (RSV, FLU A&B, COVID)  [id number]  HCG, SERUM, QUALITATIVE    EKG None  Radiology DG Chest 2 View Result Date: 04/12/2023 CLINICAL DATA:  Chest pain with productive cough. EXAM: CHEST - 2 VIEW COMPARISON:  10/24/2017 FINDINGS: The heart size and mediastinal contours are within normal limits. Both lungs are clear. The visualized skeletal structures are unremarkable. IMPRESSION: No active cardiopulmonary disease. Electronically Signed   By: Kennith Center M.D.   On: 04/12/2023 05:12    Procedures Procedures    Medications Ordered in ED Medications - No data to display  ED Course/ Medical Decision Making/ A&P                                 Medical Decision  Making Amount and/or Complexity of Data Reviewed Labs: ordered. Radiology: ordered.   This patient presents to the ED for concern of URI symptoms differential diagnosis includes COVID, flu, RSV, URI, pneumonia    Additional history obtained:  External records from outside source obtained and reviewed including OB/GYN record   Lab Tests:  I Ordered, and personally interpreted labs.  The pertinent results include: Leukocytosis of 12.1, negative respiratory panel, negative strep PCR   Imaging Studies ordered:  I ordered imaging studies including chest x-ray I independently visualized and interpreted imaging which showed no active cardiopulmonary disease I agree with the radiologist interpretation   Medicines ordered and prescription drug management:  I have reviewed the patients home medicines and have made adjustments as needed   Problem List / ED Course:  Upper respiratory infection   Social Determinants of Health:  Language barrier  Considered for admission or further workup however patients vital signs, physical exam, labs, and imaging are reassuring. Patient's symptoms likely due to upper respiratory infection.  Patient prescribed short course of Tessalon Perles for cough as needed.  Patient advised to take Tylenol/Motrin as needed for fever, Flonase as needed for nasal congestion, and plain Mucinex as needed for chest congestion. Patient should follow-up with their primary care in the upcoming week if their symptoms persist for further evaluation and workup.         Final Clinical Impression(s) / ED Diagnoses Final diagnoses:  Upper respiratory tract infection, unspecified type    Rx / DC Orders ED Discharge Orders          Ordered    benzonatate (TESSALON) 100 MG capsule  Every 8 hours        04/12/23 1031              Dolphus Jenny, PA-C 04/12/23 1032    Vanetta Mulders, MD 04/14/23 2010

## 2023-04-12 NOTE — ED Notes (Signed)
 This RN reviewed discharge instructions with patient. She verbalized understanding and denied any further questions. PT well appearing upon discharge and reports no pain. Pt ambulated with stable gait to exit. Pt endorses ride home.

## 2023-04-12 NOTE — Discharge Instructions (Addendum)
 Today you were seen for an upper respiratory infection.  Please pick up your [facility] and take for cough as needed.  You may alternate taking Tylenol and Motrin as needed for fever and pain, Flonase as needed for nasal congestion, and plain Mucinex as needed for chest congestion.  Thank you for letting us treat you today. After reviewing your labs and imaging, I feel you are safe to go home. Please follow up with your PCP in the next several days and provide them with your records from this visit. Return to the Emergency Room if pain becomes severe or symptoms worsen.

## 2023-11-28 ENCOUNTER — Other Ambulatory Visit: Payer: Self-pay

## 2023-11-28 ENCOUNTER — Encounter (HOSPITAL_COMMUNITY): Payer: Self-pay | Admitting: *Deleted

## 2023-11-28 ENCOUNTER — Emergency Department (HOSPITAL_COMMUNITY)
Admission: EM | Admit: 2023-11-28 | Discharge: 2023-11-29 | Disposition: A | Discharge status: Home / Self Care | Attending: Emergency Medicine | Admitting: Emergency Medicine

## 2023-11-28 DIAGNOSIS — D72829 Elevated white blood cell count, unspecified: Secondary | ICD-10-CM | POA: Insufficient documentation

## 2023-11-28 DIAGNOSIS — R519 Headache, unspecified: Secondary | ICD-10-CM | POA: Insufficient documentation

## 2023-11-28 DIAGNOSIS — Z91040 Latex allergy status: Secondary | ICD-10-CM | POA: Insufficient documentation

## 2023-11-28 LAB — CBC WITH DIFFERENTIAL/PLATELET
Abs Immature Granulocytes: 0.05 K/uL (ref 0.00–0.07)
Basophils Absolute: 0.1 K/uL (ref 0.0–0.1)
Basophils Relative: 1 %
Eosinophils Absolute: 0.1 K/uL (ref 0.0–0.5)
Eosinophils Relative: 1 %
HCT: 42.3 % (ref 36.0–46.0)
Hemoglobin: 14.2 g/dL (ref 12.0–15.0)
Immature Granulocytes: 0 %
Lymphocytes Relative: 38 %
Lymphs Abs: 4.3 K/uL — ABNORMAL HIGH (ref 0.7–4.0)
MCH: 29.4 pg (ref 26.0–34.0)
MCHC: 33.6 g/dL (ref 30.0–36.0)
MCV: 87.6 fL (ref 80.0–100.0)
Monocytes Absolute: 0.6 K/uL (ref 0.1–1.0)
Monocytes Relative: 6 %
Neutro Abs: 6.3 K/uL (ref 1.7–7.7)
Neutrophils Relative %: 54 %
Platelets: 384 K/uL (ref 150–400)
RBC: 4.83 MIL/uL (ref 3.87–5.11)
RDW: 12.5 % (ref 11.5–15.5)
WBC: 11.4 K/uL — ABNORMAL HIGH (ref 4.0–10.5)
nRBC: 0 % (ref 0.0–0.2)

## 2023-11-28 LAB — BASIC METABOLIC PANEL WITH GFR
Anion gap: 11 (ref 5–15)
BUN: 14 mg/dL (ref 6–20)
CO2: 23 mmol/L (ref 22–32)
Calcium: 9 mg/dL (ref 8.9–10.3)
Chloride: 103 mmol/L (ref 98–111)
Creatinine, Ser: 0.7 mg/dL (ref 0.44–1.00)
GFR, Estimated: >60 mL/min (ref >60)
Glucose, Bld: 88 mg/dL (ref 70–99)
Potassium: 3.9 mmol/L (ref 3.5–5.1)
Sodium: 137 mmol/L (ref 135–145)

## 2023-11-28 MED ORDER — LORAZEPAM 2 MG/ML IJ SOLN
1.0000 mg | Freq: Once | INTRAMUSCULAR | Status: AC | PRN
  Administered 2023-11-29: 1 mg via INTRAVENOUS
  Filled 2023-11-28: qty 1

## 2023-11-28 NOTE — ED Triage Notes (Signed)
 Patient has complaints of headache on the right side with ride sided facial numbness for the past 5 days. Denies blurred vision, dizziness. Does state she has mild light sensitivity.

## 2023-11-28 NOTE — ED Triage Notes (Addendum)
 Rt sided head and face paion for 5 days  she has been to a doctor but she reports that the med is not helping  lmp October 30th

## 2023-11-28 NOTE — ED Provider Notes (Signed)
 [facility] AT [facility] Provider Note   CSN: [id number] Arrival date & time: 11/28/23  1551     Patient presents with: Facial Pain   Meredith Gonzales is a 38 y.o. female.   The history is provided by the patient. A language interpreter was used.  Meredith Gonzales is a 38 y.o. female who presents to the Emergency Department complaining of facial pain.  She presents to the ED for evaluation of right facial pain that started on Monday.  Pain is throughout the right face and scalp.  Saw pcp and started on Monday and was started on Augmentin and carbamazepine.  She presents to the emergency department due to concern for stroke.  No associated fever, nausea, vomiting.       Prior to Admission medications   Medication Sig Start Date End Date Taking? Authorizing Provider  acetaminophen  (TYLENOL ) 500 MG tablet Take 2 tablets (1,000 mg total) by mouth every 6 (six) hours as needed. 01/22/23   Smith, [place] , CNM  benzonatate  (TESSALON ) 100 MG capsule Take 1 capsule (100 mg total) by mouth every 8 (eight) hours. 04/12/23   Francis Ileana SAILOR, PA-C  desvenlafaxine (PRISTIQ) 100 MG 24 hr tablet Take 100 mg by mouth daily. Patient not taking: Reported on 03/06/2023 01/12/23   [provider]  doxepin (SINEQUAN) 10 MG capsule Take 10 mg by mouth at bedtime. 01/29/23   [provider]  ondansetron  (ZOFRAN ) 4 MG tablet Take 1-2 tablets (4-8 mg total) by mouth every 8 (eight) hours as needed for nausea or vomiting. Patient not taking: Reported on 03/06/2023 01/22/23   Claudene, Virginia , CNM    Allergies: Latex and Other    Review of Systems  All other systems reviewed and are negative.   Updated Vital Signs BP (!) 123/92   Pulse 78   Temp 97.9 F (36.6 C) (Oral)   Resp 17   Ht 5' 4 (1.626 m)   Wt 84.8 kg   LMP 11/20/2023 (Exact Date)   SpO2 98%   Breastfeeding No   BMI 32.09 kg/m   Physical Exam Vitals and nursing note reviewed.   Constitutional:      Appearance: She is well-developed.  HENT:     Head: Normocephalic and atraumatic.     Right Ear: Tympanic membrane normal.     Left Ear: Tympanic membrane normal.  Cardiovascular:     Rate and Rhythm: Normal rate and regular rhythm.     Heart sounds: No murmur heard. Pulmonary:     Effort: Pulmonary effort is normal. No respiratory distress.     Breath sounds: Normal breath sounds.  Abdominal:     Palpations: Abdomen is soft.     Tenderness: There is no abdominal tenderness. There is no guarding or rebound.  Musculoskeletal:        General: No tenderness.     Cervical back: Neck supple.  Skin:    General: Skin is warm and dry.  Neurological:     Mental Status: She is alert and oriented to person, place, and time.     Comments: No asymmetry of facial movements.  5 out of 5 strength in all 4 extremities.  Visual fields grossly intact.  Psychiatric:        Behavior: Behavior normal.     (all labs ordered are listed, but only abnormal results are displayed) Labs Reviewed  CBC WITH DIFFERENTIAL/PLATELET - Abnormal; Notable for the following components:      Result Value  WBC 11.4 (*)    Lymphs Abs 4.3 (*)    All other components within normal limits  BASIC METABOLIC PANEL WITH GFR    EKG: None  Radiology: MR Brain W and Wo Contrast Result Date: 11/29/2023 EXAM: MRI BRAIN WITH AND WITHOUT CONTRAST 11/29/2023 05:05:34 AM TECHNIQUE: Multiplanar multisequence MRI of the head/brain was performed with and without the administration of intravenous contrast. COMPARISON: None available. CLINICAL HISTORY: 38 year old female with right-sided headache and facial numbness for 5 days, severe earache and pain. FINDINGS: BRAIN AND VENTRICLES: No acute infarct. No acute intracranial hemorrhage. No mass effect or midline shift. No hydrocephalus. The sella is unremarkable. Normal flow voids. Following contrast, the major dural venous sinuses are enhancing and appear patent.  Normal brain volume. Normal brain white matter signal. No encephalomalacia or chronic cerebral blood products identified. No abnormal enhancement of the brain parenchyma. No dural thickening or enhancement identified. Visible bilateral internal auditory structures appear grossly normal, with no abnormal enhancement identified. Unremarkable visible cisternal segments of the trigeminal nerves on series 16 image 19 and series 15 image 16. Unremarkable bilateral cavernous sinuses. ORBITS: Orbits appear normal. SINUSES: Bilateral mastoids are clear. Paranasal sinuses are well aerated. BONES AND SOFT TISSUES: Normal bone marrow signal and enhancement. Normal sialomastoid foramina. Normal visible cervical spine. Scalp and face soft tissues appear symmetric and with minimal limits. IMPRESSION: 1. Normal MRI appearance of the brain. 2. Middle ears appear symmetric and normal on routine MRI. No face or scalp abnormality identified. Electronically signed by: Helayne Hurst MD 11/29/2023 05:33 AM EST RP Workstation: [id number]     Procedures   Medications Ordered in the ED  LORazepam (ATIVAN) injection 1 mg (1 mg Intravenous Given 11/29/23 0426)  gadobutrol (GADAVIST) 1 MMOL/ML injection 8 mL (8 mLs Intravenous Contrast Given 11/29/23 0453)                                    Medical Decision Making  Patient here for evaluation of right sided facial pain for 1 week, currently on Augmentin and carbamazepine.  Examination with no evidence of acute infectious process.  CBC with mild leukocytosis, this appears baseline.  MRI is negative for acute abnormality.  Current picture is not consistent with temporal arteritis, zoster, dural sinus thrombosis.  Discussed with patient findings of studies.  Feel she is stable for discharge home with outpatient follow-up and return precautions.     Final diagnoses:  Facial pain    ED Discharge Orders     None          Griselda Norris, MD 11/29/23 [phone]

## 2023-11-28 NOTE — ED Provider Triage Note (Signed)
 Emergency Medicine Provider Triage Evaluation Note  Meredith Gonzales , a 38 y.o. female  was evaluated in triage.  Pt complains of facial pain and scalp pain for several days.  Patient is currently on carbamazepine and Augmentin for earache and pain in her face which is severe.  She was seen by her PCP a couple days ago.  She states that her pain is worsening and came in for further evaluation..  Review of Systems  Positive: Facial pain and headache Negative: Negative for bulging TM  Physical Exam  BP 125/85   Pulse 93   Temp 97.6 F (36.4 C)   Resp 18   Ht 5' 4 (1.626 m)   Wt 84.8 kg   LMP 11/20/2023 (Exact Date)   SpO2 98%   Breastfeeding No   BMI 32.09 kg/m  Gen:   Awake, no distress   Resp:  Normal effort  MSK:   Moves extremities without difficulty  Other:    Medical Decision Making  Medically screening exam initiated at 6:13 PM.  Appropriate orders placed.  Meredith Gonzales was informed that the remainder of the evaluation will be completed by another provider, this initial triage assessment does not replace that evaluation, and the importance of remaining in the ED until their evaluation is complete.     Arloa Chroman, PA-C 11/28/23 1814

## 2023-11-29 ENCOUNTER — Emergency Department (HOSPITAL_COMMUNITY)

## 2023-11-29 MED ORDER — GADOBUTROL 1 MMOL/ML IV SOLN
8.0000 mL | Freq: Once | INTRAVENOUS | Status: AC | PRN
  Administered 2023-11-29: 8 mL via INTRAVENOUS

## 2023-11-29 NOTE — ED Notes (Signed)
 Pt returned from MRI

## 2023-11-29 NOTE — ED Notes (Signed)
 Pt in MRI.
